# Patient Record
Sex: Female | Born: 1980 | Race: Black or African American | Hispanic: No | Marital: Single | Smoking: Never smoker
Health system: Southern US, Community
[De-identification: ages and names within clinical notes are randomized; demographics above are authoritative.]

## PROBLEM LIST (undated history)

## (undated) ENCOUNTER — Inpatient Hospital Stay (HOSPITAL_COMMUNITY): Payer: Self-pay

## (undated) DIAGNOSIS — Z860100 Personal history of colon polyps, unspecified: Secondary | ICD-10-CM

## (undated) DIAGNOSIS — D509 Iron deficiency anemia, unspecified: Secondary | ICD-10-CM

## (undated) DIAGNOSIS — E041 Nontoxic single thyroid nodule: Secondary | ICD-10-CM

## (undated) DIAGNOSIS — O039 Complete or unspecified spontaneous abortion without complication: Secondary | ICD-10-CM

## (undated) DIAGNOSIS — E282 Polycystic ovarian syndrome: Secondary | ICD-10-CM

## (undated) DIAGNOSIS — N6320 Unspecified lump in the left breast, unspecified quadrant: Secondary | ICD-10-CM

## (undated) DIAGNOSIS — E119 Type 2 diabetes mellitus without complications: Secondary | ICD-10-CM

## (undated) DIAGNOSIS — K589 Irritable bowel syndrome without diarrhea: Secondary | ICD-10-CM

## (undated) HISTORY — DX: Polycystic ovarian syndrome: E28.2

## (undated) HISTORY — DX: Personal history of colon polyps, unspecified: Z86.0100

## (undated) HISTORY — DX: Type 2 diabetes mellitus without complications: E11.9

## (undated) HISTORY — DX: Personal history of colonic polyps: Z86.010

## (undated) HISTORY — DX: Irritable bowel syndrome, unspecified: K58.9

## (undated) HISTORY — DX: Unspecified lump in the left breast, unspecified quadrant: N63.20

## (undated) HISTORY — PX: APPENDECTOMY: SHX54

## (undated) HISTORY — DX: Complete or unspecified spontaneous abortion without complication: O03.9

## (undated) HISTORY — DX: Nontoxic single thyroid nodule: E04.1

## (undated) HISTORY — DX: Iron deficiency anemia, unspecified: D50.9

## (undated) HISTORY — PX: OTHER SURGICAL HISTORY: SHX169

---

## 1998-07-24 ENCOUNTER — Emergency Department (HOSPITAL_COMMUNITY): Admission: EM | Admit: 1998-07-24 | Discharge: 1998-07-24 | Payer: Self-pay | Admitting: Emergency Medicine

## 2002-10-18 ENCOUNTER — Emergency Department (HOSPITAL_COMMUNITY): Admission: EM | Admit: 2002-10-18 | Discharge: 2002-10-19 | Payer: Self-pay | Admitting: Emergency Medicine

## 2002-10-19 ENCOUNTER — Encounter: Payer: Self-pay | Admitting: Emergency Medicine

## 2007-01-31 ENCOUNTER — Emergency Department (HOSPITAL_COMMUNITY): Admission: EM | Admit: 2007-01-31 | Discharge: 2007-02-01 | Payer: Self-pay | Admitting: Emergency Medicine

## 2012-05-08 ENCOUNTER — Ambulatory Visit (INDEPENDENT_AMBULATORY_CARE_PROVIDER_SITE_OTHER): Payer: BC Managed Care – PPO | Admitting: Internal Medicine

## 2012-05-08 VITALS — BP 117/77 | HR 82 | Temp 98.7°F | Resp 16 | Ht 67.18 in | Wt 200.2 lb

## 2012-05-08 DIAGNOSIS — J019 Acute sinusitis, unspecified: Secondary | ICD-10-CM

## 2012-05-08 MED ORDER — CEFDINIR 300 MG PO CAPS: 300.0000 mg | ORAL_CAPSULE | Freq: Two times a day (BID) | ORAL | Status: AC

## 2012-05-08 MED ORDER — FLUCONAZOLE 150 MG PO TABS: 150.0000 mg | ORAL_TABLET | Freq: Once | ORAL | Status: AC

## 2012-05-08 MED ORDER — FLUTICASONE PROPIONATE 50 MCG/ACT NA SUSP: NASAL | Status: DC

## 2012-05-08 NOTE — Progress Notes (Signed)
  Subjective:    Patient ID: Anna Cole, female    DOB: 1981/02/25, 31 y.o.   MRN: [id number]  [facility] Complaining of cough and congestion x3 days Positive chills Cough productive in the morning No sore throat History of allergies History of asthma in childhood but none recent    Review of Systems     Objective:   Physical Exam TMs clear Nares boggy with purulence Throat clear No nodes Chest clear to auscultation/no wheezing       Assessment & Plan:  Problem #1 acute sinusitis Meds ordered this encounter  Medications  . Drospirenone-Ethinyl Estradiol-Levomefol (SAFYRAL) 3-0.03-0.451 MG tablet    Sig: Take 1 tablet by mouth daily.  . cefdinir (OMNICEF) 300 MG capsule    Sig: Take 1 capsule (300 mg total) by mouth 2 (two) times daily.    Dispense:  14 capsule    Refill:  0  . fluticasone (FLONASE) 50 MCG/ACT nasal spray    Sig: 2 sprays each nostril twice a day    Dispense:  1 g    Refill:  1  . fluconazole (DIFLUCAN) 150 MG tablet    Sig: Take 1 tablet (150 mg total) by mouth once.    Dispense:  1 tablet    Refill:  1

## 2014-02-02 ENCOUNTER — Ambulatory Visit (INDEPENDENT_AMBULATORY_CARE_PROVIDER_SITE_OTHER): Payer: BC Managed Care – PPO

## 2014-02-02 ENCOUNTER — Encounter: Payer: Self-pay | Admitting: Family Medicine

## 2014-02-02 ENCOUNTER — Ambulatory Visit (INDEPENDENT_AMBULATORY_CARE_PROVIDER_SITE_OTHER): Payer: BC Managed Care – PPO | Admitting: Family Medicine

## 2014-02-02 VITALS — BP 122/84 | HR 86 | Temp 99.0°F | Resp 18 | Ht 66.75 in | Wt 206.2 lb

## 2014-02-02 DIAGNOSIS — M5430 Sciatica, unspecified side: Secondary | ICD-10-CM

## 2014-02-02 DIAGNOSIS — M545 Low back pain, unspecified: Secondary | ICD-10-CM

## 2014-02-02 DIAGNOSIS — L309 Dermatitis, unspecified: Secondary | ICD-10-CM

## 2014-02-02 MED ORDER — CYCLOBENZAPRINE HCL 10 MG PO TABS: 10.0000 mg | ORAL_TABLET | Freq: Three times a day (TID) | ORAL | Status: DC | PRN

## 2014-02-02 MED ORDER — TRAMADOL HCL 50 MG PO TABS: 50.0000 mg | ORAL_TABLET | Freq: Three times a day (TID) | ORAL | Status: DC | PRN

## 2014-02-02 MED ORDER — MELOXICAM 15 MG PO TABS: 15.0000 mg | ORAL_TABLET | Freq: Every day | ORAL | Status: DC

## 2014-02-02 MED ORDER — TRIAMCINOLONE ACETONIDE 0.1 % EX CREA: 1.0000 "application " | TOPICAL_CREAM | Freq: Two times a day (BID) | CUTANEOUS | Status: DC

## 2014-02-02 NOTE — Patient Instructions (Addendum)
Do not use with any other otc pain medication other than tylenol/acetaminophen - so no aleve, ibuprofen, motrin, advil, etc. I recommend keeping meloxicam on board - esp taking before work. Then when you come home, take a muscle relaxant followed by 15 minutes of heat followed by gentle stretching. Try to do this regiment 2 - 3 times a day.  If you are still having pain in 4 to 6 wks, come back to clinic for further eval. RTC immed if symptoms worsen or you develop any other concerning symptoms below.   Sciatica with Rehab The sciatic nerve runs from the back down the leg and is responsible for sensation and control of the muscles in the back (posterior) side of the thigh, lower leg, and foot. Sciatica is a condition that is characterized by inflammation of this nerve.  SYMPTOMS   Signs of nerve damage, including numbness and/or weakness along the posterior side of the lower extremity.  Pain in the back of the thigh that may also travel down the leg.  Pain that worsens when sitting for long periods of time.  Occasionally, pain in the back or buttock. CAUSES  Inflammation of the sciatic nerve is the cause of sciatica. The inflammation is due to something irritating the nerve. Common sources of irritation include:  Sitting for long periods of time.  Direct trauma to the nerve.  Arthritis of the spine.  Herniated or ruptured disk.  Slipping of the vertebrae (spondylolithesis)  Pressure from soft tissues, such as muscles or ligament-like tissue (fascia). RISK INCREASES WITH:  Sports that place pressure or stress on the spine (football or weightlifting).  Poor strength and flexibility.  Failure to warm-up properly before activity.  Family history of low back pain or disk disorders.  Previous back injury or surgery.  Poor body mechanics, especially when lifting, or poor posture. PREVENTION   Warm up and stretch properly before activity.  Maintain physical  fitness:  Strength, flexibility, and endurance.  Cardiovascular fitness.  Learn and use proper technique, especially with posture and lifting. When possible, have coach correct improper technique.  Avoid activities that place stress on the spine. PROGNOSIS If treated properly, then sciatica usually resolves within 6 weeks. However, occasionally surgery is necessary.  RELATED COMPLICATIONS   Permanent nerve damage, including pain, numbness, tingle, or weakness.  Chronic back pain.  Risks of surgery: infection, bleeding, nerve damage, or damage to surrounding tissues. TREATMENT Treatment initially involves resting from any activities that aggravate your symptoms. The use of ice and medication may help reduce pain and inflammation. The use of strengthening and stretching exercises may help reduce pain with activity. These exercises may be performed at home or with referral to a therapist. A therapist may recommend further treatments, such as transcutaneous electronic nerve stimulation (TENS) or ultrasound. Your caregiver may recommend corticosteroid injections to help reduce inflammation of the sciatic nerve. If symptoms persist despite non-surgical (conservative) treatment, then surgery may be recommended. MEDICATION  If pain medication is necessary, then nonsteroidal anti-inflammatory medications, such as aspirin and ibuprofen, or other minor pain relievers, such as acetaminophen, are often recommended.  Do not take pain medication for 7 days before surgery.  Prescription pain relievers may be given if deemed necessary by your caregiver. Use only as directed and only as much as you need.  Ointments applied to the skin may be helpful.  Corticosteroid injections may be given by your caregiver. These injections should be reserved for the most serious cases, because they may only be  given a certain number of times. HEAT AND COLD  Cold treatment (icing) relieves pain and reduces  inflammation. Cold treatment should be applied for 10 to 15 minutes every 2 to 3 hours for inflammation and pain and immediately after any activity that aggravates your symptoms. Use ice packs or massage the area with a piece of ice (ice massage).  Heat treatment may be used prior to performing the stretching and strengthening activities prescribed by your caregiver, physical therapist, or athletic trainer. Use a heat pack or soak the injury in warm water. SEEK MEDICAL CARE IF:  Treatment seems to offer no benefit, or the condition worsens.  Any medications produce adverse side effects. EXERCISES  RANGE OF MOTION (ROM) AND STRETCHING EXERCISES - Sciatica Most people with sciatic will find that their symptoms worsen with either excessive bending forward (flexion) or arching at the low back (extension). The exercises which will help resolve your symptoms will focus on the opposite motion. Your physician, physical therapist or athletic trainer will help you determine which exercises will be most helpful to resolve your low back pain. Do not complete any exercises without first consulting with your clinician. Discontinue any exercises which worsen your symptoms until you speak to your clinician. If you have pain, numbness or tingling which travels down into your buttocks, leg or foot, the goal of the therapy is for these symptoms to move closer to your back and eventually resolve. Occasionally, these leg symptoms will get better, but your low back pain may worsen; this is typically an indication of progress in your rehabilitation. Be certain to be very alert to any changes in your symptoms and the activities in which you participated in the 24 hours prior to the change. Sharing this information with your clinician will allow him/her to most efficiently treat your condition. These exercises may help you when beginning to rehabilitate your injury. Your symptoms may resolve with or without further involvement  from your physician, physical therapist or athletic trainer. While completing these exercises, remember:   Restoring tissue flexibility helps normal motion to return to the joints. This allows healthier, less painful movement and activity.  An effective stretch should be held for at least 30 seconds.  A stretch should never be painful. You should only feel a gentle lengthening or release in the stretched tissue. FLEXION RANGE OF MOTION AND STRETCHING EXERCISES: STRETCH  Flexion, Single Knee to Chest   Lie on a firm bed or floor with both legs extended in front of you.  Keeping one leg in contact with the floor, bring your opposite knee to your chest. Hold your leg in place by either grabbing behind your thigh or at your knee.  Pull until you feel a gentle stretch in your low back. Hold __________ seconds.  Slowly release your grasp and repeat the exercise with the opposite side. Repeat __________ times. Complete this exercise __________ times per day.  STRETCH  Flexion, Double Knee to Chest  Lie on a firm bed or floor with both legs extended in front of you.  Keeping one leg in contact with the floor, bring your opposite knee to your chest.  Tense your stomach muscles to support your back and then lift your other knee to your chest. Hold your legs in place by either grabbing behind your thighs or at your knees.  Pull both knees toward your chest until you feel a gentle stretch in your low back. Hold __________ seconds.  Tense your stomach muscles and slowly return  one leg at a time to the floor. Repeat __________ times. Complete this exercise __________ times per day.  STRETCH  Low Trunk Rotation   Lie on a firm bed or floor. Keeping your legs in front of you, bend your knees so they are both pointed toward the ceiling and your feet are flat on the floor.  Extend your arms out to the side. This will stabilize your upper body by keeping your shoulders in contact with the  floor.  Gently and slowly drop both knees together to one side until you feel a gentle stretch in your low back. Hold for __________ seconds.  Tense your stomach muscles to support your low back as you bring your knees back to the starting position. Repeat the exercise to the other side. Repeat __________ times. Complete this exercise __________ times per day  EXTENSION RANGE OF MOTION AND FLEXIBILITY EXERCISES: STRETCH  Extension, Prone on Elbows  Lie on your stomach on the floor, a bed will be too soft. Place your palms about shoulder width apart and at the height of your head.  Place your elbows under your shoulders. If this is too painful, stack pillows under your chest.  Allow your body to relax so that your hips drop lower and make contact more completely with the floor.  Hold this position for __________ seconds.  Slowly return to lying flat on the floor. Repeat __________ times. Complete this exercise __________ times per day.  RANGE OF MOTION  Extension, Prone Press Ups  Lie on your stomach on the floor, a bed will be too soft. Place your palms about shoulder width apart and at the height of your head.  Keeping your back as relaxed as possible, slowly straighten your elbows while keeping your hips on the floor. You may adjust the placement of your hands to maximize your comfort. As you gain motion, your hands will come more underneath your shoulders.  Hold this position __________ seconds.  Slowly return to lying flat on the floor. Repeat __________ times. Complete this exercise __________ times per day.  STRENGTHENING EXERCISES - Sciatica  These exercises may help you when beginning to rehabilitate your injury. These exercises should be done near your "sweet spot." This is the neutral, low-back arch, somewhere between fully rounded and fully arched, that is your least painful position. When performed in this safe range of motion, these exercises can be used for people who have  either a flexion or extension based injury. These exercises may resolve your symptoms with or without further involvement from your physician, physical therapist or athletic trainer. While completing these exercises, remember:   Muscles can gain both the endurance and the strength needed for everyday activities through controlled exercises.  Complete these exercises as instructed by your physician, physical therapist or athletic trainer. Progress with the resistance and repetition exercises only as your caregiver advises.  You may experience muscle soreness or fatigue, but the pain or discomfort you are trying to eliminate should never worsen during these exercises. If this pain does worsen, stop and make certain you are following the directions exactly. If the pain is still present after adjustments, discontinue the exercise until you can discuss the trouble with your clinician. STRENGTHENING Deep Abdominals, Pelvic Tilt   Lie on a firm bed or floor. Keeping your legs in front of you, bend your knees so they are both pointed toward the ceiling and your feet are flat on the floor.  Tense your lower abdominal muscles to press  your low back into the floor. This motion will rotate your pelvis so that your tail bone is scooping upwards rather than pointing at your feet or into the floor.  With a gentle tension and even breathing, hold this position for __________ seconds. Repeat __________ times. Complete this exercise __________ times per day.  STRENGTHENING  Abdominals, Crunches   Lie on a firm bed or floor. Keeping your legs in front of you, bend your knees so they are both pointed toward the ceiling and your feet are flat on the floor. Cross your arms over your chest.  Slightly tip your chin down without bending your neck.  Tense your abdominals and slowly lift your trunk high enough to just clear your shoulder blades. Lifting higher can put excessive stress on the low back and does not further  strengthen your abdominal muscles.  Control your return to the starting position. Repeat __________ times. Complete this exercise __________ times per day.  STRENGTHENING  Quadruped, Opposite UE/LE Lift  Assume a hands and knees position on a firm surface. Keep your hands under your shoulders and your knees under your hips. You may place padding under your knees for comfort.  Find your neutral spine and gently tense your abdominal muscles so that you can maintain this position. Your shoulders and hips should form a rectangle that is parallel with the floor and is not twisted.  Keeping your trunk steady, lift your right hand no higher than your shoulder and then your left leg no higher than your hip. Make sure you are not holding your breath. Hold this position __________ seconds.  Continuing to keep your abdominal muscles tense and your back steady, slowly return to your starting position. Repeat with the opposite arm and leg. Repeat __________ times. Complete this exercise __________ times per day.  STRENGTHENING  Abdominals and Quadriceps, Straight Leg Raise   Lie on a firm bed or floor with both legs extended in front of you.  Keeping one leg in contact with the floor, bend the other knee so that your foot can rest flat on the floor.  Find your neutral spine, and tense your abdominal muscles to maintain your spinal position throughout the exercise.  Slowly lift your straight leg off the floor about 6 inches for a count of 15, making sure to not hold your breath.  Still keeping your neutral spine, slowly lower your leg all the way to the floor. Repeat this exercise with each leg __________ times. Complete this exercise __________ times per day. POSTURE AND BODY MECHANICS CONSIDERATIONS - Sciatica Keeping correct posture when sitting, standing or completing your activities will reduce the stress put on different body tissues, allowing injured tissues a chance to heal and limiting painful  experiences. The following are general guidelines for improved posture. Your physician or physical therapist will provide you with any instructions specific to your needs. While reading these guidelines, remember:  The exercises prescribed by your provider will help you have the flexibility and strength to maintain correct postures.  The correct posture provides the optimal environment for your joints to work. All of your joints have less wear and tear when properly supported by a spine with good posture. This means you will experience a healthier, less painful body.  Correct posture must be practiced with all of your activities, especially prolonged sitting and standing. Correct posture is as important when doing repetitive low-stress activities (typing) as it is when doing a single heavy-load activity (lifting). RESTING POSITIONS Consider which  positions are most painful for you when choosing a resting position. If you have pain with flexion-based activities (sitting, bending, stooping, squatting), choose a position that allows you to rest in a less flexed posture. You would want to avoid curling into a fetal position on your side. If your pain worsens with extension-based activities (prolonged standing, working overhead), avoid resting in an extended position such as sleeping on your stomach. Most people will find more comfort when they rest with their spine in a more neutral position, neither too rounded nor too arched. Lying on a non-sagging bed on your side with a pillow between your knees, or on your back with a pillow under your knees will often provide some relief. Keep in mind, being in any one position for a prolonged period of time, no matter how correct your posture, can still lead to stiffness. PROPER SITTING POSTURE In order to minimize stress and discomfort on your spine, you must sit with correct posture Sitting with good posture should be effortless for a healthy body. Returning to good  posture is a gradual process. Many people can work toward this most comfortably by using various supports until they have the flexibility and strength to maintain this posture on their own. When sitting with proper posture, your ears will fall over your shoulders and your shoulders will fall over your hips. You should use the back of the chair to support your upper back. Your low back will be in a neutral position, just slightly arched. You may place a small pillow or folded towel at the base of your low back for support.  When working at a desk, create an environment that supports good, upright posture. Without extra support, muscles fatigue and lead to excessive strain on joints and other tissues. Keep these recommendations in mind: CHAIR:   A chair should be able to slide under your desk when your back makes contact with the back of the chair. This allows you to work closely.  The chair's height should allow your eyes to be level with the upper part of your monitor and your hands to be slightly lower than your elbows. BODY POSITION  Your feet should make contact with the floor. If this is not possible, use a foot rest.  Keep your ears over your shoulders. This will reduce stress on your neck and low back. INCORRECT SITTING POSTURES   If you are feeling tired and unable to assume a healthy sitting posture, do not slouch or slump. This puts excessive strain on your back tissues, causing more damage and pain. Healthier options include:  Using more support, like a lumbar pillow.  Switching tasks to something that requires you to be upright or walking.  Talking a brief walk.  Lying down to rest in a neutral-spine position. PROLONGED STANDING WHILE SLIGHTLY LEANING FORWARD  When completing a task that requires you to lean forward while standing in one place for a long time, place either foot up on a stationary 2-4 inch high object to help maintain the best posture. When both feet are on the  ground, the low back tends to lose its slight inward curve. If this curve flattens (or becomes too large), then the back and your other joints will experience too much stress, fatigue more quickly and can cause pain.  CORRECT STANDING POSTURES Proper standing posture should be assumed with all daily activities, even if they only take a few moments, like when brushing your teeth. As in sitting, your ears should  fall over your shoulders and your shoulders should fall over your hips. You should keep a slight tension in your abdominal muscles to brace your spine. Your tailbone should point down to the ground, not behind your body, resulting in an over-extended swayback posture.  INCORRECT STANDING POSTURES  Common incorrect standing postures include a forward head, locked knees and/or an excessive swayback. WALKING Walk with an upright posture. Your ears, shoulders and hips should all line-up. PROLONGED ACTIVITY IN A FLEXED POSITION When completing a task that requires you to bend forward at your waist or lean over a low surface, try to find a way to stabilize 3 of 4 of your limbs. You can place a hand or elbow on your thigh or rest a knee on the surface you are reaching across. This will provide you more stability so that your muscles do not fatigue as quickly. By keeping your knees relaxed, or slightly bent, you will also reduce stress across your low back. CORRECT LIFTING TECHNIQUES DO :   Assume a wide stance. This will provide you more stability and the opportunity to get as close as possible to the object which you are lifting.  Tense your abdominals to brace your spine; then bend at the knees and hips. Keeping your back locked in a neutral-spine position, lift using your leg muscles. Lift with your legs, keeping your back straight.  Test the weight of unknown objects before attempting to lift them.  Try to keep your elbows locked down at your sides in order get the best strength from your  shoulders when carrying an object.  Always ask for help when lifting heavy or awkward objects. INCORRECT LIFTING TECHNIQUES DO NOT:   Lock your knees when lifting, even if it is a small object.  Bend and twist. Pivot at your feet or move your feet when needing to change directions.  Assume that you cannot safely pick up a paperclip without proper posture. Document Released: 08/11/2005 Document Revised: 11/03/2011 Document Reviewed: 11/23/2008 [facility] Patient Information 2014 [place], [place]. Piriformis Syndrome with Rehab Piriformis syndrome is a condition the affects the nervous system in the area of the hip, and is characterized by pain and possibly a loss of feeling in the backside (posterior) thigh that may extend down the entire length of the leg. The symptoms are caused by an increase in pressure on the sciatic nerve by the piriformis muscle, which is on the back of the hip and is responsible for externally rotating the hip. The sciatic nerve and its branches connect to much of the leg. Normally the sciatic nerve runs between the piriformis muscle and other muscles. However, in certain individuals the nerve runs through the muscle, which causes an increase in pressure on the nerve and results in the symptoms of piriformis syndrome. SYMPTOMS   Pain, tingling, numbness, or burning in the back of the thigh that may also extend down the entire leg.  Occasionally, tenderness in the buttock.  Loss of function of the leg.  Pain that worsens when using the piriformis muscle (running, jumping, or stairs).  Pain that increases with prolonged sitting.  Pain that is lessened by laying flat on the back. CAUSES   Piriformis syndrome is the result of an increase in pressure placed on the sciatic nerve. Often times piriformis syndrome is an overuse injury.  Stress placed on the nerve from a sudden increase in the intensity, frequency, or duration of training.  Compensation of other  extremity injuries. RISK INCREASES WITH:  Sports that involve the piriformis muscle (running, walking or jumping).  You are born with (congenital) a defect in which the sciatic nerve passes through the muscle. PREVENTION  Warm up and stretch properly before activity.  Allow for adequate recovery between workouts.  Maintain physical fitness:  Strength, flexibility, and endurance.  Cardiovascular fitness. PROGNOSIS  If treated properly, then the symptoms of piriformis syndrome usually resolve in 2 to 6 weeks. RELATED COMPLICATIONS   Persistent and possibly permanent pain and numbness in the lower extremity.  Weakness of the extremity that may progress to disability and inability to compete. TREATMENT  The most effective treatment for piriformis syndrome is rest from any activities that aggravate the symptoms. Ice and pain medication may help reduce pain and inflammation. The use of strengthening and stretching exercises may help reduce pain with activity. These exercises may be performed at home or with a therapist. A referral to a therapist may be given for further evaluation and treatment, such as ultrasound. Corticosteroid injections may be given to reduce inflammation that is causing pressure to be placed on the sciatic nerve. If non-surgical (conservative) treatment is unsuccessful, then surgery may be recommended.  MEDICATION   If pain medication is necessary, then nonsteroidal anti-inflammatory medications, such as aspirin and ibuprofen, or other minor pain relievers, such as acetaminophen, are often recommended.  Do not take pain medication for 7 days before surgery.  Prescription pain relievers may be given if deemed necessary by your caregiver. Use only as directed and only as much as you need.  Corticosteroid injections may be given by your caregiver. These injections should be reserved for the most serious cases, because they may only be given a certain number of  times. HEAT AND COLD:   Cold treatment (icing) relieves pain and reduces inflammation. Cold treatment should be applied for 10 to 15 minutes every 2 to 3 hours for inflammation and pain and immediately after any activity that aggravates your symptoms. Use ice packs or massage the area with a piece of ice (ice massage).  Heat treatment may be used prior to performing the stretching and strengthening activities prescribed by your caregiver, physical therapist, or athletic trainer. Use a heat pack or soak the injury in warm water. SEEK IMMEDIATE MEDICAL CARE IF:  Treatment seems to offer no benefit, or the condition worsens.  Any medications produce adverse side effects. EXERCISES RANGE OF MOTION (ROM) AND STRETCHING EXERCISES - Piriformis Syndrome These exercises may help you when beginning to rehabilitate your injury. Your symptoms may resolve with or without further involvement from your physician, physical therapist or athletic trainer. While completing these exercises, remember:   Restoring tissue flexibility helps normal motion to return to the joints. This allows healthier, less painful movement and activity.  An effective stretch should be held for at least 30 seconds.  A stretch should never be painful. You should only feel a gentle lengthening or release in the stretched tissue. STRETCH - Hip Rotators  Lie on your back on a firm surface. Grasp your right / left knee with your right / left hand and your ankle with your opposite hand.  Keeping your hips and shoulders firmly planted, gently pull your right / left knee and rotate your lower leg toward your opposite shoulder until you feel a stretch in your buttocks.  Hold this stretch for __________ seconds. Repeat this stretch __________ times. Complete this stretch __________ times per day. STRETCH  Iliotibial Band  On the floor or bed, lie on your  side so your right / left leg is on top. Bend your knee and grab your  ankle.  Slowly bring your knee back so that your thigh is in line with your trunk. Keep your heel at your buttocks and gently arch your back so your head, shoulders and hips line up.  Slowly lower your leg so that your knee approaches the floor/bed until you feel a gentle stretch on the outside of your right / left thigh. If you do not feel a stretch and your knee will not fall farther, place the heel of your opposite foot on top of your knee and pull your thigh down farther.  Hold this stretch for __________ seconds. Repeat __________ times. Complete __________ times per day. STRENGTHENING EXERCISES - Piriformis Syndrome  These are some of the caregiver again or until your symptoms are resolved. Remember:   Strong muscles with good endurance tolerate stress better.  Do the exercises as initially prescribed by your caregiver. Progress slowly with each exercise, gradually increasing the number of repetitions and weight used under their guidance. STRENGTH - Hip Abductors, Straight Leg Raises Be aware of your form throughout the entire exercise so that you exercise the correct muscles. Sloppy form means that you are not strengthening the correct muscles.  Lie on your side so that your head, shoulders, knee and hip line up. You may bend your lower knee to help maintain your balance. Your right / left leg should be on top.  Roll your hips slightly forward, so that your hips are stacked directly over each other and your right / left knee is facing forward.  Lift your top leg up 4-6 inches, leading with your heel. Be sure that your foot does not drift forward or that your knee does not roll toward the ceiling.  Hold this position for __________ seconds. You should feel the muscles in your outer hip lifting (you may not notice this until your leg begins to tire).  Slowly lower your leg to the starting position. Allow the muscles to fully relax before beginning the next repetition. Repeat __________  times. Complete this exercise __________ times per day.  STRENGTH - Hip Abductors, Quadriped  On a firm, lightly padded surface, position yourself on your hands and knees. Your hands should be directly below your shoulders and your knees should be directly below your hips.  Keeping your right / left knee bent, lift your leg out to the side. Keep your legs level and in line with your shoulders.  Position yourself on your hands and knees.  Hold for __________ seconds.  Keeping your trunk steady and your hips level, slowly lower your leg to the starting position. Repeat __________ times. Complete this exercise __________ times per day.  STRENGTH - Hip Abductors, Standing  Tie one end of a rubber exercise band/tubing to a secure surface (table, pole) and tie a loop at the other end.  Place the loop around your right / left ankle. Keeping your ankle with the band directly opposite of the secured end, step away until there is tension in the tube/band.  Hold onto a chair as needed for balance.  Keeping your back upright, your shoulders over your hips, and your toes pointing forward, lift your right / left leg out to your side. Be sure to lift your leg with your hip muscles. Do not "throw" your leg or tip your body to lift your leg.  Slowly and with control, return to the starting position. Repeat exercise __________  times. Complete this exercise __________ times per day.  Document Released: 08/11/2005 Document Revised: 02/10/2012 Document Reviewed: 11/23/2008 [facility] Patient Information 2014 [place], [place].

## 2014-02-02 NOTE — Progress Notes (Signed)
Subjective:    Patient ID: Anna Cole, female    DOB: 1981-08-22, 33 y.o.   MRN: [id number] Chief Complaint  Patient presents with  . Back Pain    Lower back, hurt back tuesday at work lifting chairs, felt pain go down right leg when incident happend    HPI  Past Medical History  Diagnosis Date  . IBS (irritable bowel syndrome)   . Polycystic ovarian syndrome    Current Outpatient Prescriptions on File Prior to Visit  Medication Sig Dispense Refill  . Drospirenone-Ethinyl Estradiol-Levomefol (SAFYRAL) 3-0.03-0.451 MG tablet Take 1 tablet by mouth daily.      . fluticasone (FLONASE) 50 MCG/ACT nasal spray 2 sprays each nostril twice a day  1 g  1   No current facility-administered medications on file prior to visit.   Allergies  Allergen Reactions  . Penicillins Hives and Itching  . Sulfa Antibiotics Hives     Review of Systems  Constitutional: Negative for fever and chills.  Gastrointestinal: Negative for nausea, vomiting, abdominal pain, diarrhea and constipation.  Genitourinary: Negative for urgency, frequency, decreased urine volume and difficulty urinating.  Musculoskeletal: Positive for back pain and myalgias. Negative for arthralgias, gait problem and joint swelling.  Neurological: Negative for weakness and numbness.  Hematological: Negative for adenopathy. Does not bruise/bleed easily.      BP 122/84  Pulse 86  Temp(Src) 99 F (37.2 C) (Oral)  Resp 18  Ht 5' 6.75" (1.695 m)  Wt 206 lb 3.2 oz (93.532 kg)  BMI 32.56 kg/m2  SpO2 99%  LMP 01/17/2014 Objective:   Physical Exam  Constitutional: She is oriented to person, place, and time. She appears well-developed and well-nourished.  HENT:  Head: Normocephalic.  Eyes: Conjunctivae are normal. No scleral icterus.  Neck: Normal range of motion. Neck supple.  Cardiovascular: Normal rate, regular rhythm and normal heart sounds.   Pulmonary/Chest: Effort normal and breath sounds normal. No respiratory  distress.  Musculoskeletal: Normal range of motion. She exhibits no edema.       Right hip: Normal.       Left hip: Normal.       Lumbar back: She exhibits tenderness, pain and spasm. She exhibits normal range of motion, no bony tenderness and no deformity.  Neurological: She is alert and oriented to person, place, and time. She has normal strength and normal reflexes. No sensory deficit. She exhibits normal muscle tone. Coordination and gait normal.  Reflex Scores:      Patellar reflexes are 2+ on the right side and 2+ on the left side.      Achilles reflexes are 2+ on the right side and 2+ on the left side. Skin: Skin is warm and dry. Rash noted. Rash is macular. No erythema.  Psychiatric: She has a normal mood and affect. Her behavior is normal.         [facility] reading (PRIMARY) by  Dr. Clelia CroftShaw. L-spine: no acute or bony abnormality seen   EXAM: LUMBAR SPINE - COMPLETE 4+ VIEW  COMPARISON: None.  FINDINGS: Five non rib-bearing lumbar vertebrae with anatomic alignment. Slight straightening of the usual lumbar lordosis. No fractures. Minimal disc space narrowing at L4-5. Remaining disc spaces well preserved. No pars defects. No significant facet arthropathy. Visualized sacroiliac joints intact.  IMPRESSION: 1. No acute osseous abnormality. 2. Straightening of the usual lordosis which may reflect positioning and/or spasm. 3. Minimal disc space narrowing at L4-5  Assessment & Plan:  If no improvement in pain, wil be willing  to call her in steroid dose pack but if continues > 4-6 wks or worsening at all w any alarm sxs, RTC for further eval.  Low back pain - Plan: DG Lumbar Spine Complete  Sciatica  Eczema  Meds ordered this encounter  Medications  . meloxicam (MOBIC) 15 MG tablet    Sig: Take 1 tablet (15 mg total) by mouth daily.    Dispense:  30 tablet    Refill:  1  . cyclobenzaprine (FLEXERIL) 10 MG tablet    Sig: Take 1 tablet (10 mg total) by mouth 3 (three) times  daily as needed for muscle spasms.    Dispense:  30 tablet    Refill:  0  . traMADol (ULTRAM) 50 MG tablet    Sig: Take 1 tablet (50 mg total) by mouth every 8 (eight) hours as needed.    Dispense:  30 tablet    Refill:  0  . triamcinolone cream (KENALOG) 0.1 %    Sig: Apply 1 application topically 2 (two) times daily.    Dispense:  85.2 g    Refill:  1   [name], MD MPH

## 2014-05-20 ENCOUNTER — Ambulatory Visit (INDEPENDENT_AMBULATORY_CARE_PROVIDER_SITE_OTHER): Payer: BC Managed Care – PPO | Admitting: Family Medicine

## 2014-05-20 VITALS — BP 110/74 | HR 93 | Temp 98.2°F | Resp 18 | Ht 65.5 in | Wt 210.0 lb

## 2014-05-20 DIAGNOSIS — M5430 Sciatica, unspecified side: Secondary | ICD-10-CM

## 2014-05-20 DIAGNOSIS — N760 Acute vaginitis: Secondary | ICD-10-CM

## 2014-05-20 DIAGNOSIS — J4520 Mild intermittent asthma, uncomplicated: Secondary | ICD-10-CM

## 2014-05-20 DIAGNOSIS — J3089 Other allergic rhinitis: Secondary | ICD-10-CM

## 2014-05-20 DIAGNOSIS — A499 Bacterial infection, unspecified: Secondary | ICD-10-CM

## 2014-05-20 DIAGNOSIS — M5416 Radiculopathy, lumbar region: Secondary | ICD-10-CM

## 2014-05-20 DIAGNOSIS — IMO0002 Reserved for concepts with insufficient information to code with codable children: Secondary | ICD-10-CM

## 2014-05-20 DIAGNOSIS — N898 Other specified noninflammatory disorders of vagina: Secondary | ICD-10-CM

## 2014-05-20 DIAGNOSIS — B9689 Other specified bacterial agents as the cause of diseases classified elsewhere: Secondary | ICD-10-CM

## 2014-05-20 DIAGNOSIS — M5136 Other intervertebral disc degeneration, lumbar region: Secondary | ICD-10-CM

## 2014-05-20 DIAGNOSIS — M5137 Other intervertebral disc degeneration, lumbosacral region: Secondary | ICD-10-CM

## 2014-05-20 DIAGNOSIS — M5432 Sciatica, left side: Secondary | ICD-10-CM

## 2014-05-20 DIAGNOSIS — J45909 Unspecified asthma, uncomplicated: Secondary | ICD-10-CM

## 2014-05-20 LAB — POCT WET PREP WITH KOH
KOH PREP POC: NEGATIVE
TRICHOMONAS UA: NEGATIVE
YEAST WET PREP PER HPF POC: NEGATIVE

## 2014-05-20 LAB — POCT URINALYSIS DIPSTICK
BILIRUBIN UA: NEGATIVE
Blood, UA: NEGATIVE
Glucose, UA: NEGATIVE
KETONES UA: NEGATIVE
LEUKOCYTES UA: NEGATIVE
Nitrite, UA: NEGATIVE
Protein, UA: NEGATIVE
SPEC GRAV UA: 1.01
Urobilinogen, UA: 0.2
pH, UA: 6

## 2014-05-20 LAB — POCT UA - MICROSCOPIC ONLY
BACTERIA, U MICROSCOPIC: NEGATIVE
Casts, Ur, LPF, POC: NEGATIVE
Crystals, Ur, HPF, POC: NEGATIVE
Mucus, UA: NEGATIVE
RBC, URINE, MICROSCOPIC: NEGATIVE
Yeast, UA: NEGATIVE

## 2014-05-20 MED ORDER — FLUTICASONE FUROATE 200 MCG/ACT IN AEPB: 1.0000 | INHALATION_SPRAY | Freq: Every morning | RESPIRATORY_TRACT | Status: DC

## 2014-05-20 MED ORDER — METRONIDAZOLE 500 MG PO TABS: 500.0000 mg | ORAL_TABLET | Freq: Two times a day (BID) | ORAL | Status: DC

## 2014-05-20 MED ORDER — CELECOXIB 200 MG PO CAPS: 200.0000 mg | ORAL_CAPSULE | Freq: Every day | ORAL | Status: DC

## 2014-05-20 MED ORDER — FLUTICASONE PROPIONATE 50 MCG/ACT NA SUSP: 2.0000 | Freq: Every day | NASAL | Status: DC

## 2014-05-20 MED ORDER — CETIRIZINE HCL 10 MG PO TABS: 10.0000 mg | ORAL_TABLET | Freq: Every day | ORAL | Status: DC

## 2014-05-20 MED ORDER — PREDNISONE 10 MG PO TABS: ORAL_TABLET | ORAL | Status: DC

## 2014-05-20 MED ORDER — ALBUTEROL SULFATE (2.5 MG/3ML) 0.083% IN NEBU
2.5000 mg | INHALATION_SOLUTION | Freq: Once | RESPIRATORY_TRACT | Status: AC
  Administered 2014-05-20: 2.5 mg via RESPIRATORY_TRACT

## 2014-05-20 MED ORDER — ALBUTEROL SULFATE HFA 108 (90 BASE) MCG/ACT IN AERS: 2.0000 | INHALATION_SPRAY | RESPIRATORY_TRACT | Status: DC | PRN

## 2014-05-20 NOTE — Progress Notes (Addendum)
Subjective:  This chart was scribed for [name], MD by Andrew Au, ED Scribe. This patient was seen in room 1 and the patient's care was started at 12:14 PM.  Patient ID: Anna Cole, female    DOB: 26-Dec-1980, 33 y.o.   MRN: [id number]  Cough Associated symptoms include ear pain, myalgias, postnasal drip, shortness of breath and wheezing. Her past medical history is significant for asthma.  Asthma She complains of cough, shortness of breath and wheezing. Associated symptoms include ear pain, myalgias and postnasal drip. Her past medical history is significant for asthma.  Vaginal Discharge The patient's primary symptoms include a vaginal discharge.   Chief Complaint  Patient presents with  . Cough    x 3 weeks not coughing up anything  . Nasal Congestion    x 3 weeks  . Asthma    x 3 weeks thinks it all can be from asthma  . Vaginal Discharge    x 1 week w/o itching    HPI Comments: Anna Cole is a 33 y.o. female who presents to the [facility] complaining of a tight cough and nasal congestion that began 3 weeks ago. Pt reports she has recently changed position at work and has moved into an old leaky office building that she believes contains mold and mildew that is causing her allergies to flare up.  Pt reports associated chest tightness SOB, wheeze this morning as well as maxillary sinus tenderness, and otalgia, postnasal drip throughout the week. She has tried sudafed, zyrtec, and nasal sprays. Pt tried to use an old inhaler last night without relief to symptoms. Pt also took Nyquil last night without relief.   Pt reports shooting pain down posterior left leg  Pt believes this pain is from a prior injury that she was seen for 3 months ago after lifting a chair at school.  Pt states she only feels this pain when standing for long periods of time. Pt reports she has been working out and constantly moving to try to loosen up with out relief. Pt denies  weakness.  Pt is also complaining vaginal discharge for 1 week with associated itching.   Pt c/o she  white "pimples" on left nipple. Pt reports her gynecologist has popped them in the past but has now returned enlarged.  Past Medical History  Diagnosis Date  . IBS (irritable bowel syndrome)   . Polycystic ovarian syndrome    Past Surgical History  Procedure Laterality Date  . Appendectomy     Prior to Admission medications   Medication Sig Start Date End Date Taking? Authorizing Provider  cyclobenzaprine (FLEXERIL) 10 MG tablet Take 1 tablet (10 mg total) by mouth 3 (three) times daily as needed for muscle spasms. 02/02/14  Yes Sherren Mocha, MD  Drospirenone-Ethinyl Estradiol-Levomefol (SAFYRAL) 3-0.03-0.451 MG tablet Take 1 tablet by mouth daily.   Yes Historical Provider, MD  fluticasone (FLONASE) 50 MCG/ACT nasal spray 2 sprays each nostril twice a day 05/08/12  Yes Tonye Pearson, MD  meloxicam (MOBIC) 15 MG tablet Take 1 tablet (15 mg total) by mouth daily. 02/02/14  Yes Sherren Mocha, MD  triamcinolone cream (KENALOG) 0.1 % Apply 1 application topically 2 (two) times daily. 02/02/14  Yes Sherren Mocha, MD   Review of Systems  HENT: Positive for congestion, ear pain and postnasal drip.   Respiratory: Positive for cough, chest tightness, shortness of breath and wheezing.   Genitourinary: Positive for vaginal discharge.  Musculoskeletal:  Positive for myalgias.  Neurological: Negative for weakness and numbness.   Objective:   Physical Exam  HENT:  Right Ear: Hearing, tympanic membrane, external ear and ear canal normal.  Left Ear: Hearing, tympanic membrane, external ear and ear canal normal.  Nose: Mucosal edema and rhinorrhea present.  Mouth/Throat: Posterior oropharyngeal erythema present.  Neck: No thyromegaly present.  Cardiovascular: Normal rate, regular rhythm, normal heart sounds and intact distal pulses.  Exam reveals no gallop and no friction rub.   No murmur  heard. Pulmonary/Chest: Effort normal and breath sounds normal. No respiratory distress. She has no wheezes. She has no rales. She exhibits no tenderness.  Musculoskeletal:  Positive straight leg raise on left. TTP over lower lumbar spinous process. Mild tender over left SI joints.  5/5 bilateral and lower extremity pain.   Lymphadenopathy:       Head (right side): Tonsillar adenopathy present.       Head (left side): Tonsillar adenopathy present.    She has cervical adenopathy ( anterior).       Right cervical: No superficial cervical and no posterior cervical adenopathy present.      Left cervical: No superficial cervical and no posterior cervical adenopathy present.  Neurological:  Reflex Scores:      Patellar reflexes are 2+ on the right side. Unable to elicit left patellar DTR.   BP 110/74  Pulse 93  Temp(Src) 98.2 F (36.8 C) (Oral)  Resp 18  Ht 5' 5.5" (1.664 m)  Wt 210 lb (95.255 kg)  BMI 34.40 kg/m2  SpO2 99%  PF 360 L/min  LMP 05/10/2014  Results for orders placed in visit on 05/20/14  POCT UA - MICROSCOPIC ONLY      Result Value Ref Range   WBC, Ur, HPF, POC 0-1     RBC, urine, microscopic neg     Bacteria, U Microscopic neg     Mucus, UA neg     Epithelial cells, urine per micros 1-2     Crystals, Ur, HPF, POC neg     Casts, Ur, LPF, POC neg     Yeast, UA neg    POCT URINALYSIS DIPSTICK      Result Value Ref Range   Color, UA yellow     Clarity, UA clear     Glucose, UA neg     Bilirubin, UA neg     Ketones, UA neg     Spec Grav, UA 1.010     Blood, UA neg     pH, UA 6.0     Protein, UA neg     Urobilinogen, UA 0.2     Nitrite, UA neg     Leukocytes, UA Negative    POCT WET PREP WITH KOH      Result Value Ref Range   Trichomonas, UA Negative     Clue Cells Wet Prep HPF POC 75%     Epithelial Wet Prep HPF POC 20-25     Yeast Wet Prep HPF POC neg     Bacteria Wet Prep HPF POC 4+     RBC Wet Prep HPF POC 2-4     WBC Wet Prep HPF POC 2-5     KOH  Prep POC Negative      Assessment & Plan:  Vaginal discharge - Plan: POCT UA - Microscopic Only, POCT urinalysis dipstick, POCT Wet Prep with KOH  Unspecified asthma(493.90) - Plan: albuterol (PROVENTIL) (2.5 MG/3ML) 0.083% nebulizer solution 2.5 mg - try alb inhaler with prednisone  taper.   DDD (degenerative disc disease), lumbar - Plan: Ambulatory referral to Physical Therapy  Lumbar radiculopathy, acute - Plan: Ambulatory referral to Physical Therapy  Sciatica associated with disorder of lumbosacral spine, left - Plan: Ambulatory referral to Physical Therapy -prednisone taper. Start celebrex after taper. try PT, if not resolved after that, rec f/u of MRI and referral to ortho.    Other allergic rhinitis - flonase and zyrtec, rec netti pot w/ sudafed qam  Reactive airway disease, mild intermittent, uncomplicated  Bacterial vaginosis  Meds ordered this encounter  Medications  . albuterol (PROVENTIL) (2.5 MG/3ML) 0.083% nebulizer solution 2.5 mg    Sig:   . predniSONE (DELTASONE) 10 MG tablet    Sig: 6-5-4-3-2-1 tabs po qd    Dispense:  21 tablet    Refill:  0  . albuterol (PROVENTIL HFA;VENTOLIN HFA) 108 (90 BASE) MCG/ACT inhaler    Sig: Inhale 2 puffs into the lungs every 4 (four) hours as needed for wheezing or shortness of breath (cough, shortness of breath or wheezing.).    Dispense:  1 Inhaler    Refill:  11  . Fluticasone Furoate (ARNUITY ELLIPTA) 200 MCG/ACT AEPB    Sig: Inhale 1 puff into the lungs every morning.    Dispense:  30 each    Refill:  11  . fluticasone (FLONASE) 50 MCG/ACT nasal spray    Sig: Place 2 sprays into both nostrils at bedtime.    Dispense:  1 g    Refill:  11  . cetirizine (ZYRTEC) 10 MG tablet    Sig: Take 1 tablet (10 mg total) by mouth at bedtime.    Dispense:  30 tablet    Refill:  11  . celecoxib (CELEBREX) 200 MG capsule    Sig: Take 1 capsule (200 mg total) by mouth daily.    Dispense:  30 capsule    Refill:  1  . metroNIDAZOLE  (FLAGYL) 500 MG tablet    Sig: Take 1 tablet (500 mg total) by mouth 2 (two) times daily.    Dispense:  14 tablet    Refill:  0    I personally performed the services described in this documentation, which was scribed in my presence. The recorded information has been reviewed and considered, and addended by me as needed.  [name], MD MPH

## 2014-05-20 NOTE — Patient Instructions (Signed)
We will be proceed with physical therapy - hopefully by the end of that your sciatica is completely gone - if not, I would recommend referral to orthopedic surgery and possible MRI.  After you finish the prednisone taper in 6 days, then start the celebrex every morning to keep your inflammation down.  Keep the fluticasone and cetrizine on board - take every night for the next month.  Use the fluticasone inhaler every morning.  Use the albuterol inhaler whenever you are having any chest tightness or wheezing.  Consider using a nasal steroid or netti pot.  You can augment with sudafed in the morning if necessary.  If your symptoms continue, please come back into clinic as there are additional medications/inhalers/nasal sprays we can try and/or we may want to consider getting allergy testing.  Hay Fever Hay fever is an allergic reaction to particles in the air. It cannot be passed from person to person. It cannot be cured, but it can be controlled. CAUSES  Hay fever is caused by something that triggers an allergic reaction (allergens). The following are examples of allergens:  Ragweed.  Feathers.  Animal dander.  Grass and tree pollens.  Cigarette smoke.  House dust.  Pollution. SYMPTOMS   Sneezing.  Runny or stuffy nose.  Tearing eyes.  Itchy eyes, nose, mouth, throat, skin, or other area.  Sore throat.  Headache.  Decreased sense of smell or taste. DIAGNOSIS Your caregiver will perform a physical exam and ask questions about the symptoms you are having.Allergy testing may be done to determine exactly what triggers your hay fever.  TREATMENT   Over-the-counter medicines may help symptoms. These include:  Antihistamines.  Decongestants. These may help with nasal congestion.  Your caregiver may prescribe medicines if over-the-counter medicines do not work.  Some people benefit from allergy shots when other medicines are not helpful. HOME CARE INSTRUCTIONS   Avoid the  allergen that is causing your symptoms, if possible.  Take all medicine as told by your caregiver. SEEK MEDICAL CARE IF:   You have severe allergy symptoms and your current medicines are not helping.  Your treatment was working at one time, but you are now experiencing symptoms.  You have sinus congestion and pressure.  You develop a fever or headache.  You have thick nasal discharge.  You have asthma and have a worsening cough and wheezing. SEEK IMMEDIATE MEDICAL CARE IF:   You have swelling of your tongue or lips.  You have trouble breathing.  You feel lightheaded or like you are going to faint.  You have cold sweats.  You have a fever. Document Released: 08/11/2005 Document Revised: 11/03/2011 Document Reviewed: 11/06/2010 [facility] Patient Information 2015 [place], [place]. This information is not intended to replace advice given to you by your health care provider. Make sure you discuss any questions you have with your health care provider. Allergic Rhinitis Allergic rhinitis is when the mucous membranes in the nose respond to allergens. Allergens are particles in the air that cause your body to have an allergic reaction. This causes you to release allergic antibodies. Through a chain of events, these eventually cause you to release histamine into the blood stream. Although meant to protect the body, it is this release of histamine that causes your discomfort, such as frequent sneezing, congestion, and an itchy, runny nose.  CAUSES  Seasonal allergic rhinitis (hay fever) is caused by pollen allergens that may come from grasses, trees, and weeds. Year-round allergic rhinitis (perennial allergic rhinitis) is caused by  allergens such as house dust mites, pet dander, and mold spores.  SYMPTOMS   Nasal stuffiness (congestion).  Itchy, runny nose with sneezing and tearing of the eyes. DIAGNOSIS  Your health care provider can help you determine the allergen or allergens that  trigger your symptoms. If you and your health care provider are unable to determine the allergen, skin or blood testing may be used. TREATMENT  Allergic rhinitis does not have a cure, but it can be controlled by:  Medicines and allergy shots (immunotherapy).  Avoiding the allergen. Hay fever may often be treated with antihistamines in pill or nasal spray forms. Antihistamines block the effects of histamine. There are over-the-counter medicines that may help with nasal congestion and swelling around the eyes. Check with your health care provider before taking or giving this medicine.  If avoiding the allergen or the medicine prescribed do not work, there are many new medicines your health care provider can prescribe. Stronger medicine may be used if initial measures are ineffective. Desensitizing injections can be used if medicine and avoidance does not work. Desensitization is when a patient is given ongoing shots until the body becomes less sensitive to the allergen. Make sure you follow up with your health care provider if problems continue. HOME CARE INSTRUCTIONS It is not possible to completely avoid allergens, but you can reduce your symptoms by taking steps to limit your exposure to them. It helps to know exactly what you are allergic to so that you can avoid your specific triggers. SEEK MEDICAL CARE IF:   You have a fever.  You develop a cough that does not stop easily (persistent).  You have shortness of breath.  You start wheezing.  Symptoms interfere with normal daily activities. Document Released: 05/06/2001 Document Revised: 08/16/2013 Document Reviewed: 04/18/2013 [facility] Patient Information 2015 [place], [place]. This information is not intended to replace advice given to you by your health care provider. Make sure you discuss any questions you have with your health care provider. Sciatica with Rehab The sciatic nerve runs from the back down the leg and is responsible for  sensation and control of the muscles in the back (posterior) side of the thigh, lower leg, and foot. Sciatica is a condition that is characterized by inflammation of this nerve.  SYMPTOMS   Signs of nerve damage, including numbness and/or weakness along the posterior side of the lower extremity.  Pain in the back of the thigh that may also travel down the leg.  Pain that worsens when sitting for long periods of time.  Occasionally, pain in the back or buttock. CAUSES  Inflammation of the sciatic nerve is the cause of sciatica. The inflammation is due to something irritating the nerve. Common sources of irritation include:  Sitting for long periods of time.  Direct trauma to the nerve.  Arthritis of the spine.  Herniated or ruptured disk.  Slipping of the vertebrae (spondylolisthesis).  Pressure from soft tissues, such as muscles or ligament-like tissue (fascia). RISK INCREASES WITH:  Sports that place pressure or stress on the spine (football or weightlifting).  Poor strength and flexibility.  Failure to warm up properly before activity.  Family history of low back pain or disk disorders.  Previous back injury or surgery.  Poor body mechanics, especially when lifting, or poor posture. PREVENTION   Warm up and stretch properly before activity.  Maintain physical fitness:  Strength, flexibility, and endurance.  Cardiovascular fitness.  Learn and use proper technique, especially with posture and lifting. When  possible, have coach correct improper technique.  Avoid activities that place stress on the spine. PROGNOSIS If treated properly, then sciatica usually resolves within 6 weeks. However, occasionally surgery is necessary.  RELATED COMPLICATIONS   Permanent nerve damage, including pain, numbness, tingle, or weakness.  Chronic back pain.  Risks of surgery: infection, bleeding, nerve damage, or damage to surrounding tissues. TREATMENT Treatment initially  involves resting from any activities that aggravate your symptoms. The use of ice and medication may help reduce pain and inflammation. The use of strengthening and stretching exercises may help reduce pain with activity. These exercises may be performed at home or with referral to a therapist. A therapist may recommend further treatments, such as transcutaneous electronic nerve stimulation (TENS) or ultrasound. Your caregiver may recommend corticosteroid injections to help reduce inflammation of the sciatic nerve. If symptoms persist despite non-surgical (conservative) treatment, then surgery may be recommended. MEDICATION  If pain medication is necessary, then nonsteroidal anti-inflammatory medications, such as aspirin and ibuprofen, or other minor pain relievers, such as acetaminophen, are often recommended.  Do not take pain medication for 7 days before surgery.  Prescription pain relievers may be given if deemed necessary by your caregiver. Use only as directed and only as much as you need.  Ointments applied to the skin may be helpful.  Corticosteroid injections may be given by your caregiver. These injections should be reserved for the most serious cases, because they may only be given a certain number of times. HEAT AND COLD  Cold treatment (icing) relieves pain and reduces inflammation. Cold treatment should be applied for 10 to 15 minutes every 2 to 3 hours for inflammation and pain and immediately after any activity that aggravates your symptoms. Use ice packs or massage the area with a piece of ice (ice massage).  Heat treatment may be used prior to performing the stretching and strengthening activities prescribed by your caregiver, physical therapist, or athletic trainer. Use a heat pack or soak the injury in warm water. SEEK MEDICAL CARE IF:  Treatment seems to offer no benefit, or the condition worsens.  Any medications produce adverse side effects. EXERCISES  RANGE OF MOTION  (ROM) AND STRETCHING EXERCISES - Sciatica Most people with sciatic will find that their symptoms worsen with either excessive bending forward (flexion) or arching at the low back (extension). The exercises which will help resolve your symptoms will focus on the opposite motion. Your physician, physical therapist or athletic trainer will help you determine which exercises will be most helpful to resolve your low back pain. Do not complete any exercises without first consulting with your clinician. Discontinue any exercises which worsen your symptoms until you speak to your clinician. If you have pain, numbness or tingling which travels down into your buttocks, leg or foot, the goal of the therapy is for these symptoms to move closer to your back and eventually resolve. Occasionally, these leg symptoms will get better, but your low back pain may worsen; this is typically an indication of progress in your rehabilitation. Be certain to be very alert to any changes in your symptoms and the activities in which you participated in the 24 hours prior to the change. Sharing this information with your clinician will allow him/her to most efficiently treat your condition. These exercises may help you when beginning to rehabilitate your injury. Your symptoms may resolve with or without further involvement from your physician, physical therapist or athletic trainer. While completing these exercises, remember:   Restoring tissue flexibility helps  normal motion to return to the joints. This allows healthier, less painful movement and activity.  An effective stretch should be held for at least 30 seconds.  A stretch should never be painful. You should only feel a gentle lengthening or release in the stretched tissue. FLEXION RANGE OF MOTION AND STRETCHING EXERCISES: STRETCH - Flexion, Single Knee to Chest   Lie on a firm bed or floor with both legs extended in front of you.  Keeping one leg in contact with the floor,  bring your opposite knee to your chest. Hold your leg in place by either grabbing behind your thigh or at your knee.  Pull until you feel a gentle stretch in your low back. Hold __________ seconds.  Slowly release your grasp and repeat the exercise with the opposite side. Repeat __________ times. Complete this exercise __________ times per day.  STRETCH - Flexion, Double Knee to Chest  Lie on a firm bed or floor with both legs extended in front of you.  Keeping one leg in contact with the floor, bring your opposite knee to your chest.  Tense your stomach muscles to support your back and then lift your other knee to your chest. Hold your legs in place by either grabbing behind your thighs or at your knees.  Pull both knees toward your chest until you feel a gentle stretch in your low back. Hold __________ seconds.  Tense your stomach muscles and slowly return one leg at a time to the floor. Repeat __________ times. Complete this exercise __________ times per day.  STRETCH - Low Trunk Rotation   Lie on a firm bed or floor. Keeping your legs in front of you, bend your knees so they are both pointed toward the ceiling and your feet are flat on the floor.  Extend your arms out to the side. This will stabilize your upper body by keeping your shoulders in contact with the floor.  Gently and slowly drop both knees together to one side until you feel a gentle stretch in your low back. Hold for __________ seconds.  Tense your stomach muscles to support your low back as you bring your knees back to the starting position. Repeat the exercise to the other side. Repeat __________ times. Complete this exercise __________ times per day  EXTENSION RANGE OF MOTION AND FLEXIBILITY EXERCISES: STRETCH - Extension, Prone on Elbows  Lie on your stomach on the floor, a bed will be too soft. Place your palms about shoulder width apart and at the height of your head.  Place your elbows under your shoulders.  If this is too painful, stack pillows under your chest.  Allow your body to relax so that your hips drop lower and make contact more completely with the floor.  Hold this position for __________ seconds.  Slowly return to lying flat on the floor. Repeat __________ times. Complete this exercise __________ times per day.  RANGE OF MOTION - Extension, Prone Press Ups  Lie on your stomach on the floor, a bed will be too soft. Place your palms about shoulder width apart and at the height of your head.  Keeping your back as relaxed as possible, slowly straighten your elbows while keeping your hips on the floor. You may adjust the placement of your hands to maximize your comfort. As you gain motion, your hands will come more underneath your shoulders.  Hold this position __________ seconds.  Slowly return to lying flat on the floor. Repeat __________ times. Complete this exercise  __________ times per day.  STRENGTHENING EXERCISES - Sciatica  These exercises may help you when beginning to rehabilitate your injury. These exercises should be done near your "sweet spot." This is the neutral, low-back arch, somewhere between fully rounded and fully arched, that is your least painful position. When performed in this safe range of motion, these exercises can be used for people who have either a flexion or extension based injury. These exercises may resolve your symptoms with or without further involvement from your physician, physical therapist or athletic trainer. While completing these exercises, remember:   Muscles can gain both the endurance and the strength needed for everyday activities through controlled exercises.  Complete these exercises as instructed by your physician, physical therapist or athletic trainer. Progress with the resistance and repetition exercises only as your caregiver advises.  You may experience muscle soreness or fatigue, but the pain or discomfort you are trying to eliminate  should never worsen during these exercises. If this pain does worsen, stop and make certain you are following the directions exactly. If the pain is still present after adjustments, discontinue the exercise until you can discuss the trouble with your clinician. STRENGTHENING - Deep Abdominals, Pelvic Tilt   Lie on a firm bed or floor. Keeping your legs in front of you, bend your knees so they are both pointed toward the ceiling and your feet are flat on the floor.  Tense your lower abdominal muscles to press your low back into the floor. This motion will rotate your pelvis so that your tail bone is scooping upwards rather than pointing at your feet or into the floor.  With a gentle tension and even breathing, hold this position for __________ seconds. Repeat __________ times. Complete this exercise __________ times per day.  STRENGTHENING - Abdominals, Crunches   Lie on a firm bed or floor. Keeping your legs in front of you, bend your knees so they are both pointed toward the ceiling and your feet are flat on the floor. Cross your arms over your chest.  Slightly tip your chin down without bending your neck.  Tense your abdominals and slowly lift your trunk high enough to just clear your shoulder blades. Lifting higher can put excessive stress on the low back and does not further strengthen your abdominal muscles.  Control your return to the starting position. Repeat __________ times. Complete this exercise __________ times per day.  STRENGTHENING - Quadruped, Opposite UE/LE Lift  Assume a hands and knees position on a firm surface. Keep your hands under your shoulders and your knees under your hips. You may place padding under your knees for comfort.  Find your neutral spine and gently tense your abdominal muscles so that you can maintain this position. Your shoulders and hips should form a rectangle that is parallel with the floor and is not twisted.  Keeping your trunk steady, lift your  right hand no higher than your shoulder and then your left leg no higher than your hip. Make sure you are not holding your breath. Hold this position __________ seconds.  Continuing to keep your abdominal muscles tense and your back steady, slowly return to your starting position. Repeat with the opposite arm and leg. Repeat __________ times. Complete this exercise __________ times per day.  STRENGTHENING - Abdominals and Quadriceps, Straight Leg Raise   Lie on a firm bed or floor with both legs extended in front of you.  Keeping one leg in contact with the floor, bend the other knee  so that your foot can rest flat on the floor.  Find your neutral spine, and tense your abdominal muscles to maintain your spinal position throughout the exercise.  Slowly lift your straight leg off the floor about 6 inches for a count of 15, making sure to not hold your breath.  Still keeping your neutral spine, slowly lower your leg all the way to the floor. Repeat this exercise with each leg __________ times. Complete this exercise __________ times per day. POSTURE AND BODY MECHANICS CONSIDERATIONS - Sciatica Keeping correct posture when sitting, standing or completing your activities will reduce the stress put on different body tissues, allowing injured tissues a chance to heal and limiting painful experiences. The following are general guidelines for improved posture. Your physician or physical therapist will provide you with any instructions specific to your needs. While reading these guidelines, remember:  The exercises prescribed by your provider will help you have the flexibility and strength to maintain correct postures.  The correct posture provides the optimal environment for your joints to work. All of your joints have less wear and tear when properly supported by a spine with good posture. This means you will experience a healthier, less painful body.  Correct posture must be practiced with all of  your activities, especially prolonged sitting and standing. Correct posture is as important when doing repetitive low-stress activities (typing) as it is when doing a single heavy-load activity (lifting). RESTING POSITIONS Consider which positions are most painful for you when choosing a resting position. If you have pain with flexion-based activities (sitting, bending, stooping, squatting), choose a position that allows you to rest in a less flexed posture. You would want to avoid curling into a fetal position on your side. If your pain worsens with extension-based activities (prolonged standing, working overhead), avoid resting in an extended position such as sleeping on your stomach. Most people will find more comfort when they rest with their spine in a more neutral position, neither too rounded nor too arched. Lying on a non-sagging bed on your side with a pillow between your knees, or on your back with a pillow under your knees will often provide some relief. Keep in mind, being in any one position for a prolonged period of time, no matter how correct your posture, can still lead to stiffness. PROPER SITTING POSTURE In order to minimize stress and discomfort on your spine, you must sit with correct posture Sitting with good posture should be effortless for a healthy body. Returning to good posture is a gradual process. Many people can work toward this most comfortably by using various supports until they have the flexibility and strength to maintain this posture on their own. When sitting with proper posture, your ears will fall over your shoulders and your shoulders will fall over your hips. You should use the back of the chair to support your upper back. Your low back will be in a neutral position, just slightly arched. You may place a small pillow or folded towel at the base of your low back for support.  When working at a desk, create an environment that supports good, upright posture. Without extra  support, muscles fatigue and lead to excessive strain on joints and other tissues. Keep these recommendations in mind: CHAIR:   A chair should be able to slide under your desk when your back makes contact with the back of the chair. This allows you to work closely.  The chair's height should allow your eyes to be  level with the upper part of your monitor and your hands to be slightly lower than your elbows. BODY POSITION  Your feet should make contact with the floor. If this is not possible, use a foot rest.  Keep your ears over your shoulders. This will reduce stress on your neck and low back. INCORRECT SITTING POSTURES   If you are feeling tired and unable to assume a healthy sitting posture, do not slouch or slump. This puts excessive strain on your back tissues, causing more damage and pain. Healthier options include:  Using more support, like a lumbar pillow.  Switching tasks to something that requires you to be upright or walking.  Talking a brief walk.  Lying down to rest in a neutral-spine position. PROLONGED STANDING WHILE SLIGHTLY LEANING FORWARD  When completing a task that requires you to lean forward while standing in one place for a long time, place either foot up on a stationary 2-4 inch high object to help maintain the best posture. When both feet are on the ground, the low back tends to lose its slight inward curve. If this curve flattens (or becomes too large), then the back and your other joints will experience too much stress, fatigue more quickly and can cause pain.  CORRECT STANDING POSTURES Proper standing posture should be assumed with all daily activities, even if they only take a few moments, like when brushing your teeth. As in sitting, your ears should fall over your shoulders and your shoulders should fall over your hips. You should keep a slight tension in your abdominal muscles to brace your spine. Your tailbone should point down to the ground, not behind your  body, resulting in an over-extended swayback posture.  INCORRECT STANDING POSTURES  Common incorrect standing postures include a forward head, locked knees and/or an excessive swayback. WALKING Walk with an upright posture. Your ears, shoulders and hips should all line-up. PROLONGED ACTIVITY IN A FLEXED POSITION When completing a task that requires you to bend forward at your waist or lean over a low surface, try to find a way to stabilize 3 of 4 of your limbs. You can place a hand or elbow on your thigh or rest a knee on the surface you are reaching across. This will provide you more stability so that your muscles do not fatigue as quickly. By keeping your knees relaxed, or slightly bent, you will also reduce stress across your low back. CORRECT LIFTING TECHNIQUES DO :   Assume a wide stance. This will provide you more stability and the opportunity to get as close as possible to the object which you are lifting.  Tense your abdominals to brace your spine; then bend at the knees and hips. Keeping your back locked in a neutral-spine position, lift using your leg muscles. Lift with your legs, keeping your back straight.  Test the weight of unknown objects before attempting to lift them.  Try to keep your elbows locked down at your sides in order get the best strength from your shoulders when carrying an object.  Always ask for help when lifting heavy or awkward objects. INCORRECT LIFTING TECHNIQUES DO NOT:   Lock your knees when lifting, even if it is a small object.  Bend and twist. Pivot at your feet or move your feet when needing to change directions.  Assume that you cannot safely pick up a paperclip without proper posture. Document Released: 08/11/2005 Document Revised: 12/26/2013 Document Reviewed: 11/23/2008 [facility] Patient Information 2015 [place], [place]. This information  is not intended to replace advice given to you by your health care provider. Make sure you discuss any  questions you have with your health care provider.

## 2014-06-03 ENCOUNTER — Telehealth: Payer: Self-pay

## 2014-06-03 MED ORDER — FLUCONAZOLE 150 MG PO TABS: 150.0000 mg | ORAL_TABLET | Freq: Once | ORAL | Status: DC

## 2014-06-03 NOTE — Telephone Encounter (Signed)
Patient called again requesting diflucan. [facility] [place] [address] [place], [place]. Patient CB # [phone]

## 2014-06-03 NOTE — Telephone Encounter (Signed)
Diflucan sent to pharm and pt notified.

## 2014-06-03 NOTE — Telephone Encounter (Signed)
We received a call from the answering service this morning making us aware that Mrs. Anna Cole called them to complain about an rx that was prescribed at her last visit. She is experiencing some difficult side effects and would like to discuss alternative medications please.

## 2014-06-24 ENCOUNTER — Other Ambulatory Visit: Payer: Self-pay | Admitting: Family Medicine

## 2014-06-24 ENCOUNTER — Ambulatory Visit (INDEPENDENT_AMBULATORY_CARE_PROVIDER_SITE_OTHER): Payer: BC Managed Care – PPO | Admitting: Family Medicine

## 2014-06-24 VITALS — BP 126/72 | HR 76 | Temp 98.4°F | Resp 16 | Ht 67.0 in | Wt 208.2 lb

## 2014-06-24 DIAGNOSIS — R350 Frequency of micturition: Secondary | ICD-10-CM

## 2014-06-24 DIAGNOSIS — Z8619 Personal history of other infectious and parasitic diseases: Secondary | ICD-10-CM

## 2014-06-24 DIAGNOSIS — N898 Other specified noninflammatory disorders of vagina: Secondary | ICD-10-CM

## 2014-06-24 DIAGNOSIS — Z113 Encounter for screening for infections with a predominantly sexual mode of transmission: Secondary | ICD-10-CM

## 2014-06-24 LAB — POCT WET PREP WITH KOH
KOH Prep POC: NEGATIVE
Trichomonas, UA: NEGATIVE
Yeast Wet Prep HPF POC: NEGATIVE

## 2014-06-24 MED ORDER — FLUCONAZOLE 150 MG PO TABS: 150.0000 mg | ORAL_TABLET | Freq: Once | ORAL | Status: DC

## 2014-06-24 NOTE — Progress Notes (Signed)
Chief Complaint:  Chief Complaint  Patient presents with  . Vaginitis    HPI: Anna EvensShayla Cole is a 33 y.o. female who is here for  Vaginal discomfort,  Minimal thin Vaginal dc She was here for BV recently and was rx flagyl and then took it and got thrush and also vaginal dc , no itchiness, no urinary sxs just "uncomfortable feeling"  She was also given prednisone for wheezing URI sxs but never took it She was given one diflucan  When she called int for a yeast pill and felt better. But still has discomfort down there.  LMP 06/08/14 Sexullay active but has stopped dating the same guys since she thought he may be cheating on her Increase urine frequency but she drinks lots of water, no fevers , chills ,  She does not have a  hx of DM    Past Medical History  Diagnosis Date  . IBS (irritable bowel syndrome)   . Polycystic ovarian syndrome    Past Surgical History  Procedure Laterality Date  . Appendectomy     History   Social History  . Marital Status: Single    Spouse Name: N/A    Number of Children: N/A  . Years of Education: N/A   Social History Main Topics  . Smoking status: Never Smoker   . Smokeless tobacco: None  . Alcohol Use: None  . Drug Use: None  . Sexual Activity: None   Other Topics Concern  . None   Social History Narrative  . None   Family History  Problem Relation Age of Onset  . Hyperlipidemia Father   . Hypertension Father   . Diabetes Father    Allergies  Allergen Reactions  . Penicillins Hives and Itching  . Sulfa Antibiotics Hives   Prior to Admission medications   Medication Sig Start Date End Date Taking? Authorizing Provider  Drospirenone-Ethinyl Estradiol-Levomefol (SAFYRAL) 3-0.03-0.451 MG tablet Take 1 tablet by mouth daily.   Yes Historical Provider, MD  fluconazole (DIFLUCAN) 150 MG tablet Take 1 tablet (150 mg total) by mouth once. 06/03/14  Yes Sherren MochaEva N Shaw, MD  triamcinolone cream (KENALOG) 0.1 % Apply 1 application  topically 2 (two) times daily. 02/02/14  Yes Sherren MochaEva N Shaw, MD  albuterol (PROVENTIL HFA;VENTOLIN HFA) 108 (90 BASE) MCG/ACT inhaler Inhale 2 puffs into the lungs every 4 (four) hours as needed for wheezing or shortness of breath (cough, shortness of breath or wheezing.). 05/20/14   Sherren MochaEva N Shaw, MD  celecoxib (CELEBREX) 200 MG capsule Take 1 capsule (200 mg total) by mouth daily. 05/20/14   Sherren MochaEva N Shaw, MD  cetirizine (ZYRTEC) 10 MG tablet Take 1 tablet (10 mg total) by mouth at bedtime. 05/20/14   Sherren MochaEva N Shaw, MD  fluticasone (FLONASE) 50 MCG/ACT nasal spray Place 2 sprays into both nostrils at bedtime. 05/20/14   Sherren MochaEva N Shaw, MD  Fluticasone Furoate (ARNUITY ELLIPTA) 200 MCG/ACT AEPB Inhale 1 puff into the lungs every morning. 05/20/14   Sherren MochaEva N Shaw, MD  metroNIDAZOLE (FLAGYL) 500 MG tablet Take 1 tablet (500 mg total) by mouth 2 (two) times daily. 05/20/14   Sherren MochaEva N Shaw, MD  predniSONE (DELTASONE) 10 MG tablet 6-5-4-3-2-1 tabs po qd 05/20/14   Sherren MochaEva N Shaw, MD     ROS: The patient denies fevers, chills, night sweats, unintentional weight loss, chest pain, palpitations, wheezing, dyspnea on exertion, nausea, vomiting, abdominal pain,  hematuria, melena, numbness, weakness, or tingling.   All other systems  have been reviewed and were otherwise negative with the exception of those mentioned in the HPI and as above.    PHYSICAL EXAM: Filed Vitals:   06/24/14 0852  BP: 126/72  Pulse: 76  Temp: 98.4 F (36.9 C)  Resp: 16   Filed Vitals:   06/24/14 0852  Height: 5\' 7"  (1.702 m)  Weight: 208 lb 3.2 oz (94.439 kg)   Body mass index is 32.6 kg/(m^2).  General: Alert, no acute distress HEENT:  Normocephalic, atraumatic, oropharynx patent. EOMI, PERRLA. No e/o thrush Cardiovascular:  Regular rate and rhythm, no rubs murmurs or gallops.  No Carotid bruits, radial pulse intact. No pedal edema.  Respiratory: Clear to auscultation bilaterally.  No wheezes, rales, or rhonchi.  No cyanosis, no use of accessory  musculature GI: No organomegaly, abdomen is soft and non-tender, positive bowel sounds.  No masses. Skin: No rashes. Neurologic: Facial musculature symmetric. Psychiatric: Patient is appropriate throughout our interaction. Lymphatic: No cervical lymphadenopathy Musculoskeletal: Gait intact. Self swab  LABS: Results for orders placed in visit on 06/24/14  POCT WET PREP WITH KOH      Result Value Ref Range   Trichomonas, UA Negative     Clue Cells Wet Prep HPF POC 0-2     Epithelial Wet Prep HPF POC 2-6     Yeast Wet Prep HPF POC neg     Bacteria Wet Prep HPF POC 4+     RBC Wet Prep HPF POC 0-2     WBC Wet Prep HPF POC 3-6     KOH Prep POC Negative       EKG/XRAY:   Primary read interpreted [facility]   ASSESSMENT/PLAN: Encounter Diagnoses  Name Primary?  . Vaginal discharge Yes  . History of thrush   . Increased frequency of urination   . Screening for STD (sexually transmitted disease)    Urine micro and dip and cx pending GC pending Rx Diflucan since hx of thrush and yeast, repeat since she felt better STD screening F/u prn   Attempted to leave message about her UA that we did not process inhouse but had sent it all out so will have to get it from lab so will take a few days; but mail box was full  Gross sideeffects, risk and benefits, and alternatives of medications d/w patient. Patient is aware that all medications have potential sideeffects and we are unable to predict every sideeffect or drug-drug interaction that may occur.  Hamilton CapriLE, THAO PHUONG, DO 06/24/2014 5:46 PM

## 2014-06-24 NOTE — Patient Instructions (Signed)

## 2014-06-25 LAB — URINALYSIS
Bilirubin Urine: NEGATIVE
Glucose, UA: NEGATIVE mg/dL
Hgb urine dipstick: NEGATIVE
Ketones, ur: NEGATIVE mg/dL
Leukocytes, UA: NEGATIVE
Nitrite: NEGATIVE
Protein, ur: NEGATIVE mg/dL
Specific Gravity, Urine: 1.017 (ref 1.005–1.030)
Urobilinogen, UA: 0.2 mg/dL (ref 0.0–1.0)
pH: 6.5 (ref 5.0–8.0)

## 2014-06-25 LAB — URINALYSIS, MICROSCOPIC ONLY
Casts: NONE SEEN
Crystals: NONE SEEN

## 2014-06-26 ENCOUNTER — Telehealth: Payer: Self-pay | Admitting: Family Medicine

## 2014-06-26 LAB — URINE CULTURE: Colony Count: 70000

## 2014-06-26 NOTE — Telephone Encounter (Signed)
LM about labs results, GC , urine cx negative.

## 2014-06-27 LAB — GC/CHLAMYDIA PROBE AMP
CT Probe RNA: NEGATIVE
GC Probe RNA: NEGATIVE

## 2015-07-31 ENCOUNTER — Ambulatory Visit (INDEPENDENT_AMBULATORY_CARE_PROVIDER_SITE_OTHER): Payer: BC Managed Care – PPO | Admitting: Family Medicine

## 2015-07-31 VITALS — BP 124/76 | HR 105 | Temp 98.7°F | Resp 17 | Ht 66.5 in | Wt 216.0 lb

## 2015-07-31 DIAGNOSIS — J0100 Acute maxillary sinusitis, unspecified: Secondary | ICD-10-CM | POA: Diagnosis not present

## 2015-07-31 DIAGNOSIS — N898 Other specified noninflammatory disorders of vagina: Secondary | ICD-10-CM

## 2015-07-31 LAB — POCT WET + KOH PREP
Trich by wet prep: ABSENT
Yeast by KOH: ABSENT
Yeast by wet prep: ABSENT

## 2015-07-31 MED ORDER — METHYLPREDNISOLONE ACETATE 80 MG/ML IJ SUSP
80.0000 mg | Freq: Once | INTRAMUSCULAR | Status: AC
  Administered 2015-07-31: 80 mg via INTRAMUSCULAR

## 2015-07-31 MED ORDER — FLUCONAZOLE 150 MG PO TABS: 150.0000 mg | ORAL_TABLET | Freq: Once | ORAL | Status: DC

## 2015-07-31 MED ORDER — AZITHROMYCIN 250 MG PO TABS: ORAL_TABLET | ORAL | Status: DC

## 2015-07-31 MED ORDER — HYDROCOD POLST-CPM POLST ER 10-8 MG/5ML PO SUER: 5.0000 mL | Freq: Every evening | ORAL | Status: DC | PRN

## 2015-07-31 NOTE — Progress Notes (Signed)
Subjective:    Patient ID: Anna Cole, female    DOB: Dec 05, 1980, 34 y.o.   MRN: [id number] By signing my name below, I, Littie Deedsichard Sun, attest that this documentation has been prepared under the direction and in the presence of Norberto SorensonEva Courteney Alderete, MD.  Electronically Signed: Littie Deedsichard Sun, Medical Scribe. 07/31/2015. 4:05 PM.  Chief Complaint  Patient presents with  . Cough  . Nasal Congestion  . URI  . Sinusitis    HPI HPI Comments: Anna Cole is a 34 y.o. female who presents to the [facility] complaining of gradual onset rhinorrhea that started 4 days ago. Patient reports having associated nasal congestion, sinus headache, neck pain, mild productive cough of phlegm, and mild wheezing. She tried Zyrtec one day but without relief. She has also been taking Sudafed. She does not have the albuterol inhaler anymore. Patient denies fever and chills.  She would also like to be tested for yeast infection.   Past Medical History  Diagnosis Date  . IBS (irritable bowel syndrome)   . Polycystic ovarian syndrome    Current Outpatient Prescriptions on File Prior to Visit  Medication Sig Dispense Refill  . cetirizine (ZYRTEC) 10 MG tablet Take 1 tablet (10 mg total) by mouth at bedtime. 30 tablet 11  . Drospirenone-Ethinyl Estradiol-Levomefol (SAFYRAL) 3-0.03-0.451 MG tablet Take 1 tablet by mouth daily.    . fluticasone (FLONASE) 50 MCG/ACT nasal spray Place 2 sprays into both nostrils at bedtime. (Patient not taking: Reported on 07/31/2015) 1 g 11   No current facility-administered medications on file prior to visit.   Allergies  Allergen Reactions  . Penicillins Hives and Itching  . Sulfa Antibiotics Hives    Review of Systems  Constitutional: Negative for fever and chills.  HENT: Positive for congestion, rhinorrhea and sinus pressure.   Respiratory: Positive for cough and wheezing.   Musculoskeletal: Positive for neck pain.       Objective:  BP 124/76 mmHg   Pulse 105  Temp(Src) 98.7 F (37.1 C) (Oral)  Resp 17  Ht 5' 6.5" (1.689 m)  Wt 216 lb (97.977 kg)  BMI 34.35 kg/m2  SpO2 98%  LMP 07/17/2015  Physical Exam  Constitutional: She is oriented to person, place, and time. She appears well-developed and well-nourished. No distress.  HENT:  Head: Normocephalic and atraumatic.  Right Ear: A middle ear effusion is present.  Left Ear: A middle ear effusion is present.  Nose: Mucosal edema and rhinorrhea present.  Mouth/Throat: Posterior oropharyngeal erythema present. No oropharyngeal exudate.  Nasal mucosal edema and purulent rhinorrhea, left worse than right.  Eyes: Pupils are equal, round, and reactive to light.  Neck: Neck supple.  Cardiovascular: Normal rate.   Pulmonary/Chest: Effort normal.  Clear to auscultation bilaterally. Decreased air movement throughout.  Musculoskeletal: She exhibits no edema.  Lymphadenopathy:       Head (right side): Submandibular and tonsillar adenopathy present.       Head (left side): Submandibular and tonsillar adenopathy present.    She has cervical adenopathy.  Neurological: She is alert and oriented to person, place, and time. No cranial nerve deficit.  Skin: Skin is warm and dry. No rash noted.  Psychiatric: She has a normal mood and affect. Her behavior is normal.  Nursing note and vitals reviewed.     Results for orders placed or performed in visit on 07/31/15  POCT Wet + KOH Prep  Result Value Ref Range   Yeast by KOH Absent Present, Absent  Yeast by wet prep Absent Present, Absent   WBC by wet prep Moderate (A) None, Few, Too numerous to count   Clue Cells Wet Prep HPF POC Few (A) None, Too numerous to count   Trich by wet prep Absent Present, Absent   Bacteria Wet Prep HPF POC Many (A) None, Few, Too numerous to count   Epithelial Cells By [employer] [facility] Few None, Few, Too numerous to count   RBC,UR,HPF,POC None None RBC/hpf    Assessment & Plan:   1. Acute maxillary sinusitis,  recurrence not specified   2. Vaginal discharge   Snap rx given for zpack which pt will fill in 1-2d if sxs are not improving.  Orders Placed This Encounter  Procedures  . POCT Wet + KOH Prep    Meds ordered this encounter  Medications  . methylPREDNISolone acetate (DEPO-MEDROL) injection 80 mg    Sig:   . fluconazole (DIFLUCAN) 150 MG tablet    Sig: Take 1 tablet (150 mg total) by mouth once. May repeat in 4 days if no improvement    Dispense:  1 tablet    Refill:  1  . azithromycin (ZITHROMAX) 250 MG tablet    Sig: Take 2 tabs PO x 1 dose, then 1 tab PO QD x 4 days    Dispense:  6 tablet    Refill:  0  . chlorpheniramine-HYDROcodone (TUSSIONEX PENNKINETIC ER) 10-8 MG/5ML SUER    Sig: Take 5 mLs by mouth at bedtime as needed for cough.    Dispense:  90 mL    Refill:  0    I personally performed the services described in this documentation, which was scribed in my presence. The recorded information has been reviewed and considered, and addended by me as needed.  [name], MD MPH

## 2015-07-31 NOTE — Patient Instructions (Signed)

## 2015-08-03 ENCOUNTER — Telehealth: Payer: Self-pay

## 2015-08-03 NOTE — Telephone Encounter (Signed)
Patient would like a prescription called in for the bacteria that she's been diagnosed with. She said that she's having some thin watery discharge now. The area is a little irritated as well.

## 2015-08-03 NOTE — Telephone Encounter (Signed)
Patient would like a prescription called in for her because of the bacteria. She now has the thin watery discharge and it's irritating her. Dr. Clelia CroftShaw told her that she could do this for her.

## 2015-08-05 NOTE — Telephone Encounter (Signed)
I called to advise Dr Clelia CroftShaw called in for her on 07/31/[facility] [address] / left message for her to call pharmacy

## 2015-08-07 ENCOUNTER — Telehealth: Payer: Self-pay

## 2015-08-07 MED ORDER — AZITHROMYCIN 250 MG PO TABS: ORAL_TABLET | ORAL | Status: DC

## 2015-08-07 MED ORDER — METRONIDAZOLE 0.75 % VA GEL: 1.0000 | Freq: Two times a day (BID) | VAGINAL | Status: DC

## 2015-08-07 MED ORDER — FLUCONAZOLE 150 MG PO TABS: 150.0000 mg | ORAL_TABLET | Freq: Once | ORAL | Status: DC

## 2015-08-07 NOTE — Telephone Encounter (Signed)
Notified pt. 

## 2015-08-07 NOTE — Telephone Encounter (Signed)
Spoke to pt who reported that she did NOT have to take the z pak sent in at [facility] for sinus infection, so she does NOT have a yeast infection from that and doesn't need the diflucan. She IS having very uncomfortable Sxs now from the vaginal bacterial infection that showed up on lab at [facility]. Can someone please send in a Rx for that asap in Dr Alver FisherShaw's absence? Pt prefers a cream/suppository rather than oral if possible. She does have the z pak that she hasn't taken if no topical and if it would be appropriate. See allergies for oral Abxs.

## 2015-08-07 NOTE — Addendum Note (Signed)
Addended by: Morrell RiddleWEBER, Johnanna Bakke L on: 08/07/2015 01:04 PM   Modules accepted: Orders

## 2015-08-07 NOTE — Telephone Encounter (Signed)
Wasn't sure if pt had meant she didn't get either Rx, so re-sent Abx with note to ONLY fill if they did not get the Rx sent 12/6.

## 2015-08-07 NOTE — Telephone Encounter (Signed)
Called pt to advise what was sent and to not pick up Abx if she already took it. Pt reported that she does not need either of these meds. She needs something called in for the vaginal bacterial infection lab showed at [facility]. See notes under 12/9 message.

## 2015-08-07 NOTE — Telephone Encounter (Signed)
Pt called and reported that diflucan did not get to the pharm. Had operator tell pt I will re-send. Done.

## 2015-08-07 NOTE — Telephone Encounter (Signed)
I sent in metrogel

## 2015-08-07 NOTE — Addendum Note (Signed)
Addended by: Sheppard PlumberBRIGGS, Briyonna Omara A on: 08/07/2015 09:49 AM   Modules accepted: Orders

## 2015-11-10 ENCOUNTER — Other Ambulatory Visit: Payer: Self-pay | Admitting: Family Medicine

## 2016-03-28 ENCOUNTER — Ambulatory Visit (INDEPENDENT_AMBULATORY_CARE_PROVIDER_SITE_OTHER): Payer: BC Managed Care – PPO | Admitting: Emergency Medicine

## 2016-03-28 VITALS — BP 118/80 | HR 83 | Temp 98.4°F | Resp 18 | Ht 66.5 in | Wt 223.0 lb

## 2016-03-28 DIAGNOSIS — E049 Nontoxic goiter, unspecified: Secondary | ICD-10-CM

## 2016-03-28 DIAGNOSIS — J029 Acute pharyngitis, unspecified: Secondary | ICD-10-CM | POA: Diagnosis not present

## 2016-03-28 LAB — POCT CBC
GRANULOCYTE PERCENT: 60.8 % (ref 37–80)
HEMATOCRIT: 38.8 % (ref 37.7–47.9)
HEMOGLOBIN: 12.8 g/dL (ref 12.2–16.2)
Lymph, poc: 2.1 (ref 0.6–3.4)
MCH, POC: 26.3 pg — AB (ref 27–31.2)
MCHC: 33.1 g/dL (ref 31.8–35.4)
MCV: 79.7 fL — AB (ref 80–97)
MID (cbc): 0.5 (ref 0–0.9)
MPV: 8.3 fL (ref 0–99.8)
POC GRANULOCYTE: 4.1 (ref 2–6.9)
POC LYMPH %: 31.7 % (ref 10–50)
POC MID %: 7.5 % (ref 0–12)
Platelet Count, POC: 186 10*3/uL (ref 142–424)
RBC: 4.87 M/uL (ref 4.04–5.48)
RDW, POC: 15.5 %
WBC: 6.7 10*3/uL (ref 4.6–10.2)

## 2016-03-28 LAB — POCT RAPID STREP A (OFFICE): Rapid Strep A Screen: NEGATIVE

## 2016-03-28 LAB — GLUCOSE, POCT (MANUAL RESULT ENTRY): POC Glucose: 108 mg/dL — AB (ref 70–99)

## 2016-03-28 MED ORDER — CLINDAMYCIN HCL 300 MG PO CAPS: 300.0000 mg | ORAL_CAPSULE | Freq: Three times a day (TID) | ORAL | 0 refills | Status: DC

## 2016-03-28 MED ORDER — FIRST-DUKES MOUTHWASH MT SUSP: OROMUCOSAL | 1 refills | Status: DC

## 2016-03-28 NOTE — Progress Notes (Signed)
Subjective:  This chart was scribed for Lesle Chris MD, by Veverly Fells, at [facility].  This patient was seen in room 2 and the patient's care was started at 8:15 AM.   Chief Complaint  Patient presents with  . Sore Throat    possible strep throat symptoms since Monday.      Patient ID: Anna Cole, female    DOB: 1981/04/27, 35 y.o.   MRN: [id number]  HPI  HPI Comments: Anna Cole is a 35 y.o. female who presents to the [facility] complaining of a sore throat onset five days ago with associated symptoms of drainage, fever (onset two days ago- no longer present), slight cough/chest tightness, and congestion. She has been taking benadryl, Nyquil and Flonase (for the congestion) but denies complete relief.[place]mate who also had similar symptoms.Patient has had thrush in the past.  She denies a significant medical conditions. Patient has not had strep throat since she was a child.    There are no active problems to display for this patient.  Past Medical History:  Diagnosis Date  . IBS (irritable bowel syndrome)   . Polycystic ovarian syndrome    Past Surgical History:  Procedure Laterality Date  . APPENDECTOMY     Allergies  Allergen Reactions  . Penicillins Hives and Itching  . Sulfa Antibiotics Hives   Prior to Admission medications   Medication Sig Start Date End Date Taking? Authorizing Provider  Drospirenone-Ethinyl Estradiol-Levomefol (SAFYRAL) 3-0.03-0.451 MG tablet Take 1 tablet by mouth daily.    Historical Provider, MD  metroNIDAZOLE (METROGEL VAGINAL) 0.75 % vaginal gel Place 1 Applicatorful vaginally 2 (two) times daily. Patient not taking: Reported on 03/28/2016 08/07/15   Morrell Riddle, PA-C   Social History   Social History  . Marital status: Single    Spouse name: N/A  . Number of children: N/A  . Years of education: N/A   Occupational History  .  Not on file.   Social History Main Topics  . Smoking status: Never Smoker  . Smokeless tobacco: Not on file  . Alcohol use Not on file  . Drug use: Unknown  . Sexual activity: Not on file   Other Topics Concern  . Not on file   Social History Narrative  . No narrative on file       Review of Systems  Constitutional: Negative for chills and fever.  HENT: Positive for congestion and sore throat.   Eyes: Negative for pain, redness and itching.  Respiratory: Positive for cough and chest tightness.   Gastrointestinal: Negative for nausea and vomiting.  Musculoskeletal: Negative for neck pain and neck stiffness.  Skin: Negative for color change.       Objective:   Physical Exam Vitals:   03/28/16 0812  BP: 118/80  Pulse: 83  Resp: 18  Temp: 98.4 F (36.9 C)  TempSrc: Oral  SpO2: 99%  Weight: 223 lb (101.2 kg)  Height: 5' 6.5" (1.689 m)    CONSTITUTIONAL: Well developed/well nourished, alert, not ill appearing.  HEAD: Normocephalic/atraumatic EYES: EOMI/PERRL ENMT: Ears are normal, nose normal. Whitish membraneous exudate over both tonsils which are only mildly enlarged,  NECK: hyroid: symmetrically enlarged.  SPINE/BACK:entire spine nontender CV: S1/S2 noted, no murmurs/rubs/gallops noted LUNGS: Lungs are clear to auscultation bilaterally, no apparent distress NEURO: Pt is awake/alert/appropriate, moves all extremitiesx4.  No facial droop.   EXTREMITIES: pulses normal/equal, full  ROM SKIN: warm, color normal PSYCH: no abnormalities of mood noted, alert and oriented to situation  Results for orders placed or performed in visit on 03/28/16  POCT rapid strep A  Result Value Ref Range   Rapid Strep A Screen Negative Negative  POCT CBC  Result Value Ref Range   WBC 6.7 4.6 - 10.2 K/uL   Lymph, poc 2.1 0.6 - 3.4   POC LYMPH PERCENT 31.7 10 - 50 %L   MID (cbc) 0.5 0 - 0.9   POC MID % 7.5 0 - 12 %M   POC Granulocyte 4.1 2 - 6.9   Granulocyte percent 60.8 37 -  80 %G   RBC 4.87 4.04 - 5.48 M/uL   Hemoglobin 12.8 12.2 - 16.2 g/dL   HCT, POC 16.1 09.6 - 47.9 %   MCV 79.7 (A) 80 - 97 fL   MCH, POC 26.3 (A) 27 - 31.2 pg   MCHC 33.1 31.8 - 35.4 g/dL   RDW, POC 04.5 %   Platelet Count, POC 186 142 - 424 K/uL   MPV 8.3 0 - 99.8 fL  POCT glucose (manual entry)  Result Value Ref Range   POC Glucose 108 (A) 70 - 99 mg/dl          Assessment & Plan:  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 We'll go ahead and treat with Cleocin 300 3 times a day pending culture results. Strep culutre is negative we'll stop the medication. Also sent in Dukes mouthwash.I personally performed the services described in this documentation, which was scribed in my presence. The recorded information has been reviewed and is accurate.  Collene Gobble, MD

## 2016-03-28 NOTE — Patient Instructions (Addendum)
   IF you received an x-ray today, you will receive an invoice from [facility]. Please contact [facility] at [phone] with questions or concerns regarding your invoice.   IF you received labwork today, you will receive an invoice from [facility]. Please contact [facility] at [phone] with questions or concerns regarding your invoice.   Our billing staff will not be able to assist you with questions regarding bills from these companies.  You will be contacted with the lab results as soon as they are available. The fastest way to get your results is to activate your My Chart account. Instructions are located on the last page of this paperwork. If you have not heard from us regarding the results in 2 weeks, please contact this office.    Pharyngitis Pharyngitis is redness, pain, and swelling (inflammation) of your pharynx.  CAUSES  Pharyngitis is usually caused by infection. Most of the time, these infections are from viruses (viral) and are part of a cold. However, sometimes pharyngitis is caused by bacteria (bacterial). Pharyngitis can also be caused by allergies. Viral pharyngitis may be spread from person to person by coughing, sneezing, and personal items or utensils (cups, forks, spoons, toothbrushes). Bacterial pharyngitis may be spread from person to person by more intimate contact, such as kissing.  SIGNS AND SYMPTOMS  Symptoms of pharyngitis include:   Sore throat.   Tiredness (fatigue).   Low-grade fever.   Headache.  Joint pain and muscle aches.  Skin rashes.  Swollen lymph nodes.  Plaque-like film on throat or tonsils (often seen with bacterial pharyngitis). DIAGNOSIS  Your health care provider will ask you questions about your illness and your symptoms. Your medical history, along with a physical exam, is often all that is needed to diagnose pharyngitis. Sometimes, a rapid strep test is done. Other lab tests may  also be done, depending on the suspected cause.  TREATMENT  Viral pharyngitis will usually get better in 3-4 days without the use of medicine. Bacterial pharyngitis is treated with medicines that kill germs (antibiotics).  HOME CARE INSTRUCTIONS   Drink enough water and fluids to keep your urine clear or pale yellow.   Only take over-the-counter or prescription medicines as directed by your health care provider:   If you are prescribed antibiotics, make sure you finish them even if you start to feel better.   Do not take aspirin.   Get lots of rest.   Gargle with 8 oz of salt water ( tsp of salt per 1 qt of water) as often as every 1-2 hours to soothe your throat.   Throat lozenges (if you are not at risk for choking) or sprays may be used to soothe your throat. SEEK MEDICAL CARE IF:   You have large, tender lumps in your neck.  You have a rash.  You cough up green, yellow-brown, or bloody spit. SEEK IMMEDIATE MEDICAL CARE IF:   Your neck becomes stiff.  You drool or are unable to swallow liquids.  You vomit or are unable to keep medicines or liquids down.  You have severe pain that does not go away with the use of recommended medicines.  You have trouble breathing (not caused by a stuffy nose). MAKE SURE YOU:   Understand these instructions.  Will watch your condition.  Will get help right away if you are not doing well or get worse.   This information is not intended to replace advice given to you by your health care   provider. Make sure you discuss any questions you have with your health care provider.   Document Released: 08/11/2005 Document Revised: 06/01/2013 Document Reviewed: 04/18/2013 Elsevier Interactive Patient Education 2016 [employer]vier Inc.  

## 2016-03-29 LAB — CULTURE, GROUP A STREP

## 2016-03-31 ENCOUNTER — Telehealth: Payer: Self-pay

## 2016-03-31 NOTE — Telephone Encounter (Signed)
Tell patient to take the medicine for a total of 7 days and if the white spots or persistent I would like to see her back.

## 2016-03-31 NOTE — Telephone Encounter (Signed)
Routed to lab pool  

## 2016-03-31 NOTE — Telephone Encounter (Signed)
Dr. Cleta Albertsaub spoke with pt about her lab results. And she wanted to know should she continue her antibiotic and says she still sees white spots.

## 2016-03-31 NOTE — Telephone Encounter (Signed)
PATIENT IS RETURNING Anna Cole CALL FROM THIS MORNING REGARDING HER LAB RESULTS. SHE SAID TO PLEASE LEAVE A MESSAGE ON HER VOICE MAIL IF SHE DOES NOT PICK UP. BEST PHONE [phone] (CELL)  [facility] [address]  MBC

## 2016-03-31 NOTE — Telephone Encounter (Signed)
Advised pt

## 2016-04-08 ENCOUNTER — Telehealth: Payer: Self-pay

## 2016-04-08 NOTE — Telephone Encounter (Signed)
Pt is calling in because she was seen on 03/28/16 for throat issues. She was prescribed an antibiotic for her throat and now that she has finished the antibiotics the white pockets have came back on her throat. She wants to know if she could have a different medication or if she should try to go see a Dr. In [place] where she is currently residing. Please call patient back with suggestions for her please.

## 2016-04-10 NOTE — Telephone Encounter (Signed)
Pt stated that the white patches have gone away again, but I advised that if they come back again to go to a provider there. Pt asked if we can send in the [facility] Rx there to [place] because they never got it when it was sent here. She would like to use it now. Pt will CB with name of pharm there so we can resend it or have it transferred from this pharm if they do have it.

## 2016-04-10 NOTE — Telephone Encounter (Signed)
She should follow up with a doctor in [place]. This could be of other etiology

## 2016-04-29 ENCOUNTER — Telehealth: Payer: Self-pay

## 2016-04-29 NOTE — Telephone Encounter (Signed)
Pharmacy is actually [facility] on [address] [place]

## 2016-04-29 NOTE — Telephone Encounter (Signed)
Patient is calling to see if Dr. Clelia CroftShaw can send her a prescription for Mucinex or something for congestion medication. Patient if congested due the the temperature changes. [phone]  [facility] [address], [place], [place]

## 2016-04-30 NOTE — Telephone Encounter (Signed)
mucinex is otc. That's fine if you want to send in 600mg  bid of the 12hr release - it is tax free w/ a rx.

## 2016-05-05 NOTE — Telephone Encounter (Signed)
Voicemail left regarding pts pharmacy. Advised to call back.address [facility] [address] [facility] [place] is not correct.

## 2016-05-06 NOTE — Telephone Encounter (Signed)
Pt stated that she is feeling much better now. I advised for future reference that she can get the same strength Mucinex OTC w/out a Rx, but none is needed now.

## 2016-08-07 ENCOUNTER — Telehealth: Payer: Self-pay | Admitting: Family Medicine
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
## 2016-08-11 NOTE — Telephone Encounter (Signed)
Patient was called and left message stating she would have to come back  for an office visit for the requesting pain medications.

## 2016-08-11 NOTE — Telephone Encounter (Signed)
Correct - recommend she use otc ibuprofen 600-800mg  three times a day, can alternate with tylenol.  Try a heating pad several times a day and gentle stretching. If having any pain radiating down her legs, weakness or numbness in legs, changes in bowel or bladder will need to go to an UC for eval in [place].

## 2017-04-26 ENCOUNTER — Ambulatory Visit (HOSPITAL_COMMUNITY)
Admission: EM | Admit: 2017-04-26 | Discharge: 2017-04-26 | Disposition: A | Discharge status: Home / Self Care | Payer: BC Managed Care – PPO | Attending: Family Medicine | Admitting: Family Medicine

## 2017-04-26 ENCOUNTER — Encounter (HOSPITAL_COMMUNITY): Payer: Self-pay | Admitting: *Deleted

## 2017-04-26 DIAGNOSIS — R0602 Shortness of breath: Secondary | ICD-10-CM | POA: Diagnosis not present

## 2017-04-26 DIAGNOSIS — R062 Wheezing: Secondary | ICD-10-CM | POA: Diagnosis not present

## 2017-04-26 DIAGNOSIS — J309 Allergic rhinitis, unspecified: Secondary | ICD-10-CM | POA: Diagnosis not present

## 2017-04-26 MED ORDER — FLUTICASONE PROPIONATE 50 MCG/ACT NA SUSP: 2.0000 | Freq: Every day | NASAL | 2 refills | Status: DC

## 2017-04-26 MED ORDER — ALBUTEROL SULFATE 108 (90 BASE) MCG/ACT IN AEPB: 1.0000 | INHALATION_SPRAY | Freq: Four times a day (QID) | RESPIRATORY_TRACT | 2 refills | Status: DC | PRN

## 2017-04-26 MED ORDER — LEVOCETIRIZINE DIHYDROCHLORIDE 5 MG PO TABS: 5.0000 mg | ORAL_TABLET | Freq: Every evening | ORAL | 0 refills | Status: DC

## 2017-04-26 MED ORDER — METHYLPREDNISOLONE SODIUM SUCC 125 MG IJ SOLR
80.0000 mg | Freq: Once | INTRAMUSCULAR | Status: AC
  Administered 2017-04-26: 80 mg via INTRAMUSCULAR

## 2017-04-26 MED ORDER — METHYLPREDNISOLONE SODIUM SUCC 125 MG IJ SOLR
INTRAMUSCULAR | Status: AC
  Filled 2017-04-26: qty 2

## 2017-04-26 NOTE — Discharge Instructions (Signed)
Ok to substitute Xyzal for [employer].   Make sure to use nasal spray once daily, 2 sprays each side.  Follow up with PCP if symptoms fail to improve.  Seek care sooner if you start having fevers, shortness of breath, worsening symptoms.

## 2017-04-26 NOTE — ED Triage Notes (Signed)
Patient reports nasal congestion, cough, tightness in chest. Hx of asthma. Patient states that she started at a new school and she thinks there may be mold in the room.

## 2017-04-26 NOTE — ED Provider Notes (Signed)
[facility]    CSN: [id number] Arrival date & time: 04/26/17  1726     History   Chief Complaint Chief Complaint  Patient presents with  . Nasal Congestion    HPI Anna Cole is a 36 y.o. female.   HPI Duration: 3 days  Associated symptoms: sinus congestion, sinus pain, rhinorrhea, ear fullness, wheezing and shortness of breath Denies: itchy watery eyes, ear pain, ear drainage, sore throat and fevers/rigors Treatment to date: Allegra, Flonase Sick contacts: No Works in an old building, things there is mold.  Used to live in [place], did not need inhaler w hx of asthma.    Past Medical History:  Diagnosis Date  . IBS (irritable bowel syndrome)   . Polycystic ovarian syndrome     There are no active problems to display for this patient.   Past Surgical History:  Procedure Laterality Date  . APPENDECTOMY       Home Medications    Prior to Admission medications   Medication Sig Start Date End Date Taking? Authorizing Provider  Albuterol Sulfate 108 (90 Base) MCG/ACT AEPB Inhale 1 puff into the lungs every 6 (six) hours as needed. 04/26/17   Sharlene DoryWendling, Nicholas Paul, DO  Diphenhyd-Hydrocort-Nystatin (FIRST-DUKES MOUTHWASH) SUSP 1 teaspoon as rinse gargle and sp 4 times a day 03/28/16   Collene Gobbleaub, Steven A, MD  Drospirenone-Ethinyl Estradiol-Levomefol (SAFYRAL) 3-0.03-0.451 MG tablet Take 1 tablet by mouth daily.    [provider]  fluticasone (FLONASE) 50 MCG/ACT nasal spray Place 2 sprays into both nostrils daily. 04/26/17   Sharlene DoryWendling, Nicholas Paul, DO  levocetirizine (XYZAL) 5 MG tablet Take 1 tablet (5 mg total) by mouth every evening. 04/26/17   Sharlene DoryWendling, Nicholas Paul, DO    Family History Family History  Problem Relation Age of Onset  . Hyperlipidemia Father   . Hypertension Father   . Diabetes Father     Social History Social History  Substance Use Topics  . Smoking status: Never Smoker  . Smokeless tobacco: Never Used  . Alcohol use Not on  file     Allergies   Penicillins and Sulfa antibiotics   Review of Systems Review of Systems  Constitutional: Negative for fever.  Respiratory:       Denies current SOB     Physical Exam Triage Vital Signs ED Triage Vitals  Enc Vitals Group     BP 04/26/17 1823 121/78     Pulse Rate 04/26/17 1823 78     Resp 04/26/17 1823 17     Temp 04/26/17 1823 98.1 F (36.7 C)     Temp Source 04/26/17 1823 Oral     SpO2 04/26/17 1823 100 %   Updated Vital Signs BP 121/78 (BP Location: Left Arm)   Pulse 78   Temp 98.1 F (36.7 C) (Oral)   Resp 17   SpO2 100%   Physical Exam  Constitutional: She is oriented to person, place, and time. She appears well-developed and well-nourished.  HENT:  Head: Normocephalic and atraumatic.  Right Ear: External ear normal.  Left Ear: External ear normal.  Nose: Nose normal.  Mouth/Throat: Oropharynx is clear and moist. No oropharyngeal exudate.  Eyes: Pupils are equal, round, and reactive to light. EOM are normal.  Neck: Normal range of motion. Neck supple.  Cardiovascular: Normal rate, regular rhythm and normal heart sounds.   No murmur heard. Pulmonary/Chest: Effort normal and breath sounds normal. No respiratory distress. She has no wheezes.  Neurological: She is alert and oriented to  person, place, and time.  Psychiatric: She has a normal mood and affect. Judgment normal.     UC Treatments / Results  Procedures Procedures none  Medications Ordered in UC Medications  methylPREDNISolone sodium succinate (SOLU-MEDROL) 125 mg/2 mL injection 80 mg (80 mg Intramuscular Given 04/26/17 1912)     Initial Impression / Assessment and Plan / UC Course  I have reviewed the triage vital signs and the nursing notes.  Pertinent labs & imaging results that were available during my care of the patient were reviewed by me and considered in my medical decision making (see chart for details).     Pt presents with allergy like symptoms. Will  exchange Allegra for Xyzal. Flonase called in to see if insurance will cover. Solu medrol today. Refill Albuterol. If no improvement, f/u with PCP. If worsening or new symptoms, return or seek immediate care. The patient voiced understanding and agreement to the plan.   Final Clinical Impressions(s) / UC Diagnoses   Final diagnoses:  Allergic rhinitis, unspecified seasonality, unspecified trigger    New Prescriptions Discharge Medication List as of 04/26/2017  7:13 PM    START taking these medications   Details  Albuterol Sulfate 108 (90 Base) MCG/ACT AEPB Inhale 1 puff into the lungs every 6 (six) hours as needed., Starting Sun 04/26/2017, Normal    fluticasone (FLONASE) 50 MCG/ACT nasal spray Place 2 sprays into both nostrils daily., Starting Sun 04/26/2017, Normal    levocetirizine (XYZAL) 5 MG tablet Take 1 tablet (5 mg total) by mouth every evening., Starting Sun 04/26/2017, Normal         Controlled Substance Prescriptions [place] Controlled Substance Registry consulted? Not Applicable   Sharlene Dory, [place] 04/26/17 2218

## 2017-05-23 ENCOUNTER — Other Ambulatory Visit: Payer: Self-pay | Admitting: Family Medicine

## 2017-06-29 ENCOUNTER — Ambulatory Visit: Payer: BC Managed Care – PPO | Admitting: Family Medicine

## 2017-06-29 ENCOUNTER — Encounter: Payer: Self-pay | Admitting: Family Medicine

## 2017-06-29 VITALS — BP 128/70 | HR 76 | Temp 98.7°F | Resp 16 | Ht 66.5 in | Wt 216.8 lb

## 2017-06-29 DIAGNOSIS — N760 Acute vaginitis: Secondary | ICD-10-CM | POA: Insufficient documentation

## 2017-06-29 DIAGNOSIS — E785 Hyperlipidemia, unspecified: Secondary | ICD-10-CM | POA: Diagnosis not present

## 2017-06-29 DIAGNOSIS — E6609 Other obesity due to excess calories: Secondary | ICD-10-CM | POA: Diagnosis not present

## 2017-06-29 DIAGNOSIS — R002 Palpitations: Secondary | ICD-10-CM

## 2017-06-29 DIAGNOSIS — Z6834 Body mass index (BMI) 34.0-34.9, adult: Secondary | ICD-10-CM

## 2017-06-29 HISTORY — DX: Palpitations: R00.2

## 2017-06-29 LAB — POCT WET + KOH PREP
Trich by wet prep: ABSENT
Yeast by KOH: ABSENT
Yeast by wet prep: ABSENT

## 2017-06-29 MED ORDER — FLUCONAZOLE 150 MG PO TABS: 150.0000 mg | ORAL_TABLET | Freq: Once | ORAL | 0 refills | Status: AC

## 2017-06-29 MED ORDER — METRONIDAZOLE 500 MG PO TABS: 500.0000 mg | ORAL_TABLET | Freq: Two times a day (BID) | ORAL | 0 refills | Status: DC

## 2017-06-29 NOTE — Patient Instructions (Addendum)
For cholesterol and weight loss:  Please start red yeast rice over the counter Increase your intake of fiber  For vaginal discharge Take the metronidazole After completing take the diflucan Take probiotic throughout  For palpitations Drink plenty of water and will notify you of results  Palpitations A palpitation is the feeling that your heartbeat is irregular or is faster than normal. It may feel like your heart is fluttering or skipping a beat. Palpitations are usually not a serious problem. They may be caused by many things, including smoking, caffeine, alcohol, stress, and certain medicines. Although most causes of palpitations are not serious, palpitations can be a sign of a serious medical problem. In some cases, you may need further medical evaluation. Follow these instructions at home: Pay attention to any changes in your symptoms. Take these actions to help with your condition:  Avoid the following: ? Caffeinated coffee, tea, soft drinks, diet pills, and energy drinks. ? Chocolate. ? Alcohol.  Do not use any tobacco products, such as cigarettes, chewing tobacco, and e-cigarettes. If you need help quitting, ask your health care provider.  Try to reduce your stress and anxiety. Things that can help you relax include: ? Yoga. ? Meditation. ? Physical activity, such as swimming, jogging, or walking. ? Biofeedback. This is a method that helps you learn to use your mind to control things in your body, such as your heartbeats.  Get plenty of rest and sleep.  Take over-the-counter and prescription medicines only as told by your health care provider.  Keep all follow-up visits as told by your health care provider. This is important.  Contact a health care provider if:  You continue to have a fast or irregular heartbeat after 24 hours.  Your palpitations occur more often. Get help right away if:  You have chest pain or shortness of breath.  You have a severe  headache.  You feel dizzy or you faint. This information is not intended to replace advice given to you by your health care provider. Make sure you discuss any questions you have with your health care provider. Document Released: 08/08/2000 Document Revised: 01/14/2016 Document Reviewed: 04/26/2015 Elsevier Interactive Patient Education  2017 [employer]

## 2017-06-29 NOTE — Assessment & Plan Note (Signed)
ECG within normal limits. Will check thyroid and CBC Increased hydration advised If it persists then will refer for holter monitoring

## 2017-06-29 NOTE — Progress Notes (Addendum)
Chief Complaint  Patient presents with  . ? bacterial infection    ongoing between yeast and bacterial  . Chest Pain    x 2-3 weeks, fluttery feeling in chest and pt has to take a deep breath    HPI  She reports that she has been having a thin watery discharge Patient's last menstrual period was 06/22/2017. She reports that the discharge was present before the period and continued She reports that she had a steroid shot a few weeks Her last intercourse was in April 2018 she did not use a condom She denies douching She uses a fragrant spray in the inguinal crease but not in the vaginal area She denies vaginal itching She reports that she has previous history of vaginitis and her gynecologist in the past gave her boric acid  Pt reports that she went to Bariatric program and discussed her lipids She reports that she has intermittent palpitations that lasts a few seconds She stopped working out in the past year She reports that she was dancing vigorously at church and had to sit down due to coughing. She had to take her inhaler and it stopped. She reports that if she eats she does not notice the "rushing in her heart" and never during bowel movement. She reports that she can feel it while laying down at night.  She denies radiation She reports that she gets some night sweats but no sweats with the pain  obesity She reports that she does not eat much at work She was advised to eat 1200 calories and has not been able to do that She does not exercise She tried some meds but no appetite suppressants She has normal bm   Past Medical History:  Diagnosis Date  . IBS (irritable bowel syndrome)   . Polycystic ovarian syndrome     Current Outpatient Medications  Medication Sig Dispense Refill  . Albuterol Sulfate 108 (90 Base) MCG/ACT AEPB Inhale 1 puff into the lungs every 6 (six) hours as needed. 1 each 2  . fluticasone (FLONASE) 50 MCG/ACT nasal spray Place 2 sprays into both  nostrils daily. 16 g 2  . levocetirizine (XYZAL) 5 MG tablet Take 1 tablet (5 mg total) by mouth every evening. 30 tablet 0  . Diphenhyd-Hydrocort-Nystatin (FIRST-DUKES MOUTHWASH) SUSP 1 teaspoon as rinse gargle and sp 4 times a day (Patient not taking: Reported on 06/29/2017) 120 mL 1  . Drospirenone-Ethinyl Estradiol-Levomefol (SAFYRAL) 3-0.03-0.451 MG tablet Take 1 tablet by mouth daily.    . fluconazole (DIFLUCAN) 150 MG tablet Take 1 tablet (150 mg total) once for 1 dose by mouth. 1 tablet 0  . metroNIDAZOLE (FLAGYL) 500 MG tablet Take 1 tablet (500 mg total) 2 (two) times daily by mouth. 14 tablet 0   No current facility-administered medications for this visit.     Allergies:  Allergies  Allergen Reactions  . Penicillins Hives and Itching  . Sulfa Antibiotics Hives    Past Surgical History:  Procedure Laterality Date  . APPENDECTOMY      Social History   Socioeconomic History  . Marital status: Single    Spouse name: Not on file  . Number of children: Not on file  . Years of education: Not on file  . Highest education level: Not on file  Social Needs  . Financial resource strain: Not on file  . Food insecurity - worry: Not on file  . Food insecurity - inability: Not on file  . Transportation needs - medical: Not  on file  . Transportation needs - non-medical: Not on file  Occupational History  . Not on file  Tobacco Use  . Smoking status: Never Smoker  . Smokeless tobacco: Never Used  Substance and Sexual Activity  . Alcohol use: Not on file  . Drug use: Not on file  . Sexual activity: Not on file  Other Topics Concern  . Not on file  Social History Narrative  . Not on file    Family History  Problem Relation Age of Onset  . Hyperlipidemia Father   . Hypertension Father   . Diabetes Father      ROS Review of Systems See HPI Constitution: No fevers or chills No malaise No diaphoresis Skin: No rash or itching Eyes: no blurry vision, no double  vision GU: no dysuria or hematuria Neuro: no dizziness or headaches  Objective: Vitals:   06/29/17 1014  BP: 128/70  Pulse: 76  Resp: 16  Temp: 98.7 F (37.1 C)  TempSrc: Oral  SpO2: 98%  Weight: 216 lb 12.8 oz (98.3 kg)  Height: 5' 6.5" (1.689 m)   Body mass index is 34.47 kg/m.  Physical Exam  Constitutional: She is oriented to person, place, and time. She appears well-developed and well-nourished.  HENT:  Head: Normocephalic and atraumatic.  Cardiovascular: Normal rate and regular rhythm.  Abdominal: Soft. Bowel sounds are normal. She exhibits no distension and no mass. There is no tenderness. There is no guarding.  Musculoskeletal: Normal range of motion.       Right lower leg: She exhibits no edema.       Left lower leg: She exhibits no edema.  Neurological: She is alert and oriented to person, place, and time.  Psychiatric: She has a normal mood and affect. Her behavior is normal.    Vaginal exam- chaperone preset Labia normal bilaterally without skin lesions Urethral meatus normal appearing without erythema Vagina with white, thick discharge No CMT, ovaries small and not palpable Uterus midline, nontender  ECG -NSR  Assessment and Plan  Problem List Items Addressed This Visit      Genitourinary   Acute vaginitis - Primary    Will treat with metronidazole Will give diflucan as well Continue probiotics      Relevant Orders   POCT Wet + KOH Prep (Completed)   GC/Chlamydia Probe Amp     Other   Dyslipidemia    Diagnosed at diet clinic. Discussed red yeast rice and fiber as well as weight loss.       Relevant Orders   Lipid panel   Comprehensive metabolic panel   Class 1 obesity due to excess calories without serious comorbidity with body mass index (BMI) of 34.0 to 34.9 in adult    Discussed that calorie restricted diets do not help weight loss but can slow metabolism. She eats very few calories. Discussed increasing the quality of the foods and  having more healthy snacks. Exercising consistently.      Relevant Orders   Hemoglobin A1c   Lipid panel   Comprehensive metabolic panel   Palpitations    ECG within normal limits. Will check thyroid and CBC Increased hydration advised If it persists then will refer for holter monitoring      Relevant Orders   EKG 12-Lead (Completed)   TSH   CBC       Wilman Tucker A Shandora Koogler

## 2017-06-29 NOTE — Assessment & Plan Note (Signed)
Discussed that calorie restricted diets do not help weight loss but can slow metabolism. She eats very few calories. Discussed increasing the quality of the foods and having more healthy snacks. Exercising consistently.

## 2017-06-29 NOTE — Assessment & Plan Note (Signed)
Diagnosed at diet clinic. Discussed red yeast rice and fiber as well as weight loss.

## 2017-06-29 NOTE — Assessment & Plan Note (Signed)
Will treat with metronidazole Will give diflucan as well Continue probiotics

## 2017-06-30 LAB — COMPREHENSIVE METABOLIC PANEL
ALK PHOS: 72 IU/L (ref 39–117)
ALT: 13 IU/L (ref 0–32)
AST: 14 IU/L (ref 0–40)
Albumin/Globulin Ratio: 1.6 (ref 1.2–2.2)
Albumin: 4.6 g/dL (ref 3.5–5.5)
BUN / CREAT RATIO: 11 (ref 9–23)
BUN: 9 mg/dL (ref 6–20)
Bilirubin Total: 0.5 mg/dL (ref 0.0–1.2)
CO2: 22 mmol/L (ref 20–29)
CREATININE: 0.83 mg/dL (ref 0.57–1.00)
Calcium: 9.6 mg/dL (ref 8.7–10.2)
Chloride: 100 mmol/L (ref 96–106)
GFR calc Af Amer: 103 mL/min/{1.73_m2} (ref >59)
GFR calc non Af Amer: 90 mL/min/{1.73_m2} (ref >59)
GLOBULIN, TOTAL: 2.9 g/dL (ref 1.5–4.5)
Glucose: 95 mg/dL (ref 65–99)
POTASSIUM: 4.1 mmol/L (ref 3.5–5.2)
SODIUM: 138 mmol/L (ref 134–144)
Total Protein: 7.5 g/dL (ref 6.0–8.5)

## 2017-06-30 LAB — CBC
HEMOGLOBIN: 12.9 g/dL (ref 11.1–15.9)
Hematocrit: 39.1 % (ref 34.0–46.6)
MCH: 27.4 pg (ref 26.6–33.0)
MCHC: 33 g/dL (ref 31.5–35.7)
MCV: 83 fL (ref 79–97)
Platelets: 258 10*3/uL (ref 150–379)
RBC: 4.7 x10E6/uL (ref 3.77–5.28)
RDW: 15 % (ref 12.3–15.4)
WBC: 5.3 10*3/uL (ref 3.4–10.8)

## 2017-06-30 LAB — LIPID PANEL
CHOL/HDL RATIO: 4 ratio (ref 0.0–4.4)
CHOLESTEROL TOTAL: 200 mg/dL — AB (ref 100–199)
HDL: 50 mg/dL (ref >39)
LDL Calculated: 130 mg/dL — ABNORMAL HIGH (ref 0–99)
TRIGLYCERIDES: 102 mg/dL (ref 0–149)
VLDL Cholesterol Cal: 20 mg/dL (ref 5–40)

## 2017-06-30 LAB — HEMOGLOBIN A1C
ESTIMATED AVERAGE GLUCOSE: 128 mg/dL
HEMOGLOBIN A1C: 6.1 % — AB (ref 4.8–5.6)

## 2017-06-30 LAB — GC/CHLAMYDIA PROBE AMP
Chlamydia trachomatis, NAA: NEGATIVE
NEISSERIA GONORRHOEAE BY PCR: NEGATIVE

## 2017-06-30 LAB — TSH: TSH: 0.568 u[IU]/mL (ref 0.450–4.500)

## 2017-07-04 ENCOUNTER — Other Ambulatory Visit: Payer: Self-pay

## 2017-07-04 ENCOUNTER — Encounter (HOSPITAL_COMMUNITY): Payer: Self-pay | Admitting: Emergency Medicine

## 2017-07-04 ENCOUNTER — Emergency Department (HOSPITAL_COMMUNITY)
Admission: EM | Admit: 2017-07-04 | Discharge: 2017-07-04 | Disposition: A | Discharge status: Home / Self Care | Payer: No Typology Code available for payment source | Attending: Emergency Medicine | Admitting: Emergency Medicine

## 2017-07-04 DIAGNOSIS — Y92410 Unspecified street and highway as the place of occurrence of the external cause: Secondary | ICD-10-CM | POA: Diagnosis not present

## 2017-07-04 DIAGNOSIS — S0990XA Unspecified injury of head, initial encounter: Secondary | ICD-10-CM | POA: Insufficient documentation

## 2017-07-04 DIAGNOSIS — Z79899 Other long term (current) drug therapy: Secondary | ICD-10-CM | POA: Diagnosis not present

## 2017-07-04 DIAGNOSIS — M542 Cervicalgia: Secondary | ICD-10-CM | POA: Diagnosis not present

## 2017-07-04 DIAGNOSIS — Y9389 Activity, other specified: Secondary | ICD-10-CM | POA: Diagnosis not present

## 2017-07-04 DIAGNOSIS — M25512 Pain in left shoulder: Secondary | ICD-10-CM | POA: Insufficient documentation

## 2017-07-04 DIAGNOSIS — Y998 Other external cause status: Secondary | ICD-10-CM | POA: Diagnosis not present

## 2017-07-04 MED ORDER — CYCLOBENZAPRINE HCL 5 MG PO TABS: 5.0000 mg | ORAL_TABLET | Freq: Two times a day (BID) | ORAL | 0 refills | Status: DC | PRN

## 2017-07-04 MED ORDER — CYCLOBENZAPRINE HCL 10 MG PO TABS
5.0000 mg | ORAL_TABLET | Freq: Once | ORAL | Status: AC
  Administered 2017-07-04: 5 mg via ORAL
  Filled 2017-07-04: qty 1

## 2017-07-04 MED ORDER — IBUPROFEN 800 MG PO TABS
800.0000 mg | ORAL_TABLET | Freq: Once | ORAL | Status: AC
  Administered 2017-07-04: 800 mg via ORAL
  Filled 2017-07-04: qty 1

## 2017-07-04 MED ORDER — NAPROXEN 500 MG PO TABS: 500.0000 mg | ORAL_TABLET | Freq: Two times a day (BID) | ORAL | 0 refills | Status: DC
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
## 2017-07-06 ENCOUNTER — Ambulatory Visit: Payer: BC Managed Care – PPO | Admitting: Family Medicine

## 2017-07-06 ENCOUNTER — Ambulatory Visit: Payer: Self-pay | Admitting: *Deleted

## 2017-07-06 ENCOUNTER — Encounter: Payer: Self-pay | Admitting: Family Medicine

## 2017-07-06 ENCOUNTER — Other Ambulatory Visit: Payer: Self-pay

## 2017-07-06 VITALS — BP 110/82 | HR 88 | Temp 98.8°F | Resp 16 | Ht 67.0 in | Wt 216.2 lb

## 2017-07-06 DIAGNOSIS — M542 Cervicalgia: Secondary | ICD-10-CM | POA: Diagnosis not present

## 2017-07-06 DIAGNOSIS — M62838 Other muscle spasm: Secondary | ICD-10-CM
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
## 2017-07-06 NOTE — Patient Instructions (Addendum)
Continue antiinflammatory and flexeril for your pain as well as using an ice pack    IF you received an x-ray today, you will receive an invoice from [facility]. Please contact [facility] at [phone] with questions or concerns regarding your invoice.   IF you received labwork today, you will receive an invoice from [facility]. Please contact [facility] at [phone] with questions or concerns regarding your invoice.   Our billing staff will not be able to assist you with questions regarding bills from these companies.  You will be contacted with the lab results as soon as they are available. The fastest way to get your results is to activate your My Chart account. Instructions are located on the last page of this paperwork. If you have not heard from us regarding the results in 2 weeks, please contact this office.     Cervical Strain and Sprain Rehab Ask your health care provider which exercises are safe for you. Do exercises exactly as told by your health care provider and adjust them as directed. It is normal to feel mild stretching, pulling, tightness, or discomfort as you do these exercises, but you should stop right away if you feel sudden pain or your pain gets worse.Do not begin these exercises until told by your health care provider. Stretching and range of motion exercises These exercises warm up your muscles and joints and improve the movement and flexibility of your neck. These exercises also help to relieve pain, numbness, and tingling. Exercise A: Cervical side bend  1. Using good posture, sit on a stable chair or stand up. 2. Without moving your shoulders, slowly tilt your left / right ear to your shoulder until you feel a stretch in your neck muscles. You should be looking straight ahead. 3. Hold for __________ seconds. 4. Repeat with the other side of your neck. Repeat __________ times. Complete this exercise __________ times a day. Exercise B: Cervical  rotation  1. Using good posture, sit on a stable chair or stand up. 2. Slowly turn your head to the side as if you are looking over your left / right shoulder. ? Keep your eyes level with the ground. ? Stop when you feel a stretch along the side and the back of your neck. 3. Hold for __________ seconds. 4. Repeat this by turning to your other side. Repeat __________ times. Complete this exercise __________ times a day. Exercise C: Thoracic extension and pectoral stretch 1. Roll a towel or a small blanket so it is about 4 inches (10 cm) in diameter. 2. Lie down on your back on a firm surface. 3. Put the towel lengthwise, under your spine in the middle of your back. It should not be not under your shoulder blades. The towel should line up with your spine from your middle back to your lower back. 4. Put your hands behind your head and let your elbows fall out to your sides. 5. Hold for __________ seconds. Repeat __________ times. Complete this exercise __________ times a day. Strengthening exercises These exercises build strength and endurance in your neck. Endurance is the ability to use your muscles for a long time, even after your muscles get tired. Exercise D: Upper cervical flexion, isometric 1. Lie on your back with a thin pillow behind your head and a small rolled-up towel under your neck. 2. Gently tuck your chin toward your chest and nod your head down to look toward your feet. Do not lift your head off the pillow. 3.  Hold for __________ seconds. 4. Release the tension slowly. Relax your neck muscles completely before you repeat this exercise. Repeat __________ times. Complete this exercise __________ times a day. Exercise E: Cervical extension, isometric  1. Stand about 6 inches (15 cm) away from a wall, with your back facing the wall. 2. Place a soft object, about 6-8 inches (15-20 cm) in diameter, between the back of your head and the wall. A soft object could be a small pillow, a  ball, or a folded towel. 3. Gently tilt your head back and press into the soft object. Keep your jaw and forehead relaxed. 4. Hold for __________ seconds. 5. Release the tension slowly. Relax your neck muscles completely before you repeat this exercise. Repeat __________ times. Complete this exercise __________ times a day. Posture and body mechanics  Body mechanics refers to the movements and positions of your body while you do your daily activities. Posture is part of body mechanics. Good posture and healthy body mechanics can help to relieve stress in your body's tissues and joints. Good posture means that your spine is in its natural S-curve position (your spine is neutral), your shoulders are pulled back slightly, and your head is not tipped forward. The following are general guidelines for applying improved posture and body mechanics to your everyday activities. Standing  When standing, keep your spine neutral and keep your feet about hip-width apart. Keep a slight bend in your knees. Your ears, shoulders, and hips should line up.  When you do a task in which you stand in one place for a long time, place one foot up on a stable object that is 2-4 inches (5-10 cm) high, such as a footstool. This helps keep your spine neutral. Sitting   When sitting, keep your spine neutral and your keep feet flat on the floor. Use a footrest, if necessary, and keep your thighs parallel to the floor. Avoid rounding your shoulders, and avoid tilting your head forward.  When working at a desk or a computer, keep your desk at a height where your hands are slightly lower than your elbows. Slide your chair under your desk so you are close enough to maintain good posture.  When working at a computer, place your monitor at a height where you are looking straight ahead and you do not have to tilt your head forward or downward to look at the screen. Resting When lying down and resting, avoid positions that are most  painful for you. Try to support your neck in a neutral position. You can use a contour pillow or a small rolled-up towel. Your pillow should support your neck but not push on it. This information is not intended to replace advice given to you by your health care provider. Make sure you discuss any questions you have with your health care provider. Document Released: 08/11/2005 Document Revised: 04/17/2016 Document Reviewed: 07/18/2015 Elsevier Interactive Patient Education  [employer]2018 [employer]

## 2017-07-15 ENCOUNTER — Telehealth: Payer: Self-pay | Admitting: Family Medicine

## 2017-07-15 NOTE — Telephone Encounter (Signed)
Pt is requesting refill on the Dukes mouthwash and something like Diflucan for yeast that she has developed from her antibiotic. She would the like these meds sent to a [facility] at [address], [place], [place] [id number].

## 2017-07-17 NOTE — Telephone Encounter (Signed)
Preferred pharmacy updated.  Provider, please prescribe if appropriate.

## 2017-07-17 NOTE — Telephone Encounter (Signed)
I have not seen this pt, don't know about these meds - has been seeing Stallings.

## 2017-09-05 ENCOUNTER — Ambulatory Visit: Payer: BC Managed Care – PPO | Admitting: Family Medicine

## 2017-09-22 ENCOUNTER — Telehealth: Payer: Self-pay | Admitting: *Deleted

## 2017-09-22 NOTE — Telephone Encounter (Signed)
Lft vmail for pt to call back and schedule Annual/pap.. 

## 2017-10-12 ENCOUNTER — Other Ambulatory Visit: Payer: Self-pay

## 2017-10-12 ENCOUNTER — Ambulatory Visit (HOSPITAL_COMMUNITY)
Admission: EM | Admit: 2017-10-12 | Discharge: 2017-10-12 | Disposition: A | Discharge status: Home / Self Care | Payer: BC Managed Care – PPO | Attending: Family Medicine | Admitting: Family Medicine

## 2017-10-12 DIAGNOSIS — R6889 Other general symptoms and signs: Secondary | ICD-10-CM | POA: Diagnosis not present

## 2017-10-12 MED ORDER — OSELTAMIVIR PHOSPHATE 75 MG PO CAPS: 75.0000 mg | ORAL_CAPSULE | Freq: Two times a day (BID) | ORAL | 0 refills | Status: DC

## 2017-10-12 MED ORDER — ACETAMINOPHEN 325 MG PO TABS
ORAL_TABLET | ORAL | Status: AC
  Filled 2017-10-12: qty 2

## 2017-10-12 MED ORDER — ACETAMINOPHEN 325 MG PO TABS
650.0000 mg | ORAL_TABLET | Freq: Once | ORAL | Status: AC
  Administered 2017-10-12: 650 mg via ORAL

## 2017-10-12 NOTE — ED Provider Notes (Signed)
[facility]    CSN: [id number] Arrival date & time: 10/12/17  1945     History   Chief Complaint Chief Complaint  Patient presents with  . Cough    HPI Anna Cole is a 37 y.o. female.   HPI  Duration: 1 week; today started having cough, fevers, chills, and muscle aches; still having sinus congestion and runny nose Denies: sinus pain, itchy watery eyes, ear pain, ear drainage, sore throat, wheezing and shortness of breath Treatment to date: None Sick contacts: No    Past Medical History:  Diagnosis Date  . IBS (irritable bowel syndrome)   . Polycystic ovarian syndrome     Patient Active Problem List   Diagnosis Date Noted  . Dyslipidemia 06/29/2017  . Class 1 obesity due to excess calories without serious comorbidity with body mass index (BMI) of 34.0 to 34.9 in adult 06/29/2017  . Palpitations 06/29/2017  . Acute vaginitis 06/29/2017    Past Surgical History:  Procedure Laterality Date  . APPENDECTOMY    . polyps removed from colon      Home Medications    Prior to Admission medications   Medication Sig Start Date End Date Taking? Authorizing Provider  Albuterol Sulfate 108 (90 Base) MCG/ACT AEPB Inhale 1 puff into the lungs every 6 (six) hours as needed. 04/26/17  Yes Sharlene DoryWendling, Haly Feher Paul, DO  oseltamivir (TAMIFLU) 75 MG capsule Take 1 capsule (75 mg total) by mouth every 12 (twelve) hours. 10/12/17   Sharlene DoryWendling, Deavon Podgorski Paul, DO    Family History Family History  Problem Relation Age of Onset  . Hyperlipidemia Father   . Hypertension Father   . Diabetes Father     Social History Social History   Tobacco Use  . Smoking status: Never Smoker  . Smokeless tobacco: Never Used  Substance Use Topics  . Alcohol use: Yes    Comment: rare  . Drug use: No     Allergies   Penicillins and Sulfa antibiotics   Review of Systems Review of Systems  Constitutional: Positive for chills and fever.  Respiratory: Positive for cough.       Physical Exam Triage Vital Signs ED Triage Vitals  Enc Vitals Group     BP 10/12/17 2011 (!) 138/93     Pulse Rate 10/12/17 2011 100     Resp 10/12/17 2011 18     Temp 10/12/17 2011 (!) 101.1 F (38.4 C)     Temp Source 10/12/17 2011 Oral     SpO2 10/12/17 2011 100 %   Updated Vital Signs BP (!) 138/93 (BP Location: Left Arm)   Pulse 100   Temp (!) 101.1 F (38.4 C) (Oral)   Resp 18   LMP 09/17/2017 (Exact Date)   SpO2 100%   Physical Exam  Constitutional: She appears well-developed.  HENT:  Head: Normocephalic.  Right Ear: External ear normal.  Left Ear: External ear normal.  Nose: Nose normal.  Mouth/Throat: Oropharynx is clear and moist. No oropharyngeal exudate.  Eyes: Pupils are equal, round, and reactive to light.  Neck: Normal range of motion. Neck supple.  Cardiovascular: Normal rate and regular rhythm.  No murmur heard. Pulmonary/Chest: Effort normal and breath sounds normal. No respiratory distress.  Psychiatric: She has a normal mood and affect. Judgment normal.     UC Treatments / Results  Procedures Procedures none  Medications Ordered in UC Medications  acetaminophen (TYLENOL) tablet 650 mg (650 mg Oral Given 10/12/17 2018)     Initial Impression /  Assessment and Plan / UC Course  I have reviewed the triage vital signs and the nursing notes.  Pertinent labs & imaging results that were available during my care of the patient were reviewed by me and considered in my medical decision making (see chart for details).     37 yo BF presents w s/s's of flu. Since her flu symptoms started today, will tx with antiviral. Cont supportive care w fluids, Tylenol, NSAIDs as needed. Letter for work given excusing her to return on Thurs 2/21 or sooner if she is feeling better. She also requested I disclose her dx and the fact she had a fever today which I did in her letter. Warning s/s's verbalized and written down F/u w pcp prn.   Final Clinical  Impressions(s) / UC Diagnoses   Final diagnoses:  Flu-like symptoms    ED Discharge Orders        Ordered    oseltamivir (TAMIFLU) 75 MG capsule  Every 12 hours     10/12/17 2026       Controlled Substance Prescriptions [place] Controlled Substance Registry consulted? Not Applicable   Sharlene Dory, [place] 10/12/17 2035

## 2017-10-12 NOTE — ED Triage Notes (Signed)
[facility] a [facility], cold chills and body aches x 1 week.

## 2017-10-12 NOTE — Discharge Instructions (Signed)
Continue to push fluids, practice good hand hygiene, and cover your mouth if you cough.  If you start having increasing fevers, shaking or shortness of breath, seek immediate care.  Ibuprofen 400-600 mg (2-3 over the counter strength tabs) every 6 hours as needed for pain.  OK to take Tylenol 1000 mg (2 extra strength tabs) or 975 mg (3 regular strength tabs) every 6 hours as needed.  For symptoms, consider using Vick's VapoRub on chest or under nose, air humidifier, Benadryl at night, and elevating the head of the bed.

## 2018-01-28 ENCOUNTER — Encounter: Payer: Self-pay | Admitting: Family Medicine

## 2018-01-28 ENCOUNTER — Other Ambulatory Visit: Payer: Self-pay

## 2018-01-28 ENCOUNTER — Ambulatory Visit: Payer: BC Managed Care – PPO | Admitting: Family Medicine

## 2018-01-28 ENCOUNTER — Ambulatory Visit (INDEPENDENT_AMBULATORY_CARE_PROVIDER_SITE_OTHER): Payer: BC Managed Care – PPO

## 2018-01-28 VITALS — BP 130/76 | HR 100 | Temp 99.7°F | Ht 67.0 in | Wt 214.0 lb

## 2018-01-28 DIAGNOSIS — R05 Cough: Secondary | ICD-10-CM

## 2018-01-28 DIAGNOSIS — R059 Cough, unspecified: Secondary | ICD-10-CM

## 2018-01-28 DIAGNOSIS — R062 Wheezing: Secondary | ICD-10-CM

## 2018-01-28 MED ORDER — METHYLPREDNISOLONE SODIUM SUCC 125 MG IJ SOLR
125.0000 mg | Freq: Once | INTRAMUSCULAR | Status: AC
  Administered 2018-01-28: 125 mg via INTRAMUSCULAR

## 2018-01-28 MED ORDER — ALBUTEROL SULFATE (2.5 MG/3ML) 0.083% IN NEBU
2.5000 mg | INHALATION_SOLUTION | Freq: Once | RESPIRATORY_TRACT | Status: AC
  Administered 2018-01-28: 2.5 mg via RESPIRATORY_TRACT

## 2018-01-28 MED ORDER — AZITHROMYCIN 250 MG PO TABS: ORAL_TABLET | ORAL | 0 refills | Status: DC

## 2018-01-28 NOTE — Progress Notes (Signed)
Chief Complaint  Patient presents with  . Shortness of Breath    HPI  Pt reports that she has been cough with wheezing  She has a lot of mucus as well Her sister has pneumonia Reports some shortness of breath No fevers or chills No history of asthma Reports some fatigue  Past Medical History:  Diagnosis Date  . IBS (irritable bowel syndrome)   . Polycystic ovarian syndrome     Current Outpatient Medications  Medication Sig Dispense Refill  . Albuterol Sulfate 108 (90 Base) MCG/ACT AEPB Inhale 1 puff into the lungs every 6 (six) hours as needed. 1 each 2  . azithromycin (ZITHROMAX) 250 MG tablet Take 2 tablets on  Day 1, take 1 each day after 6 tablet 0  . oseltamivir (TAMIFLU) 75 MG capsule Take 1 capsule (75 mg total) by mouth every 12 (twelve) hours. 10 capsule 0   Current Facility-Administered Medications  Medication Dose Route Frequency Provider Last Rate Last Dose  . albuterol (PROVENTIL) (2.5 MG/3ML) 0.083% nebulizer solution 2.5 mg  2.5 mg Nebulization Once Krystl Wickware A, MD      . methylPREDNISolone sodium succinate (SOLU-MEDROL) 125 mg/2 mL injection 125 mg  125 mg Intramuscular Once Doristine BosworthStallings, Airabella Barley A, MD        Allergies:  Allergies  Allergen Reactions  . Penicillins Hives and Itching  . Sulfa Antibiotics Hives    Past Surgical History:  Procedure Laterality Date  . APPENDECTOMY    . polyps removed from colon      Social History   Socioeconomic History  . Marital status: Single    Spouse name: Not on file  . Number of children: Not on file  . Years of education: Not on file  . Highest education level: Not on file  Occupational History  . Not on file  Social Needs  . Financial resource strain: Not on file  . Food insecurity:    Worry: Not on file    Inability: Not on file  . Transportation needs:    Medical: Not on file    Non-medical: Not on file  Tobacco Use  . Smoking status: Never Smoker  . Smokeless tobacco: Never Used  Substance  and Sexual Activity  . Alcohol use: Yes    Comment: rare  . Drug use: No  . Sexual activity: Not on file  Lifestyle  . Physical activity:    Days per week: Not on file    Minutes per session: Not on file  . Stress: Not on file  Relationships  . Social connections:    Talks on phone: Not on file    Gets together: Not on file    Attends religious service: Not on file    Active member of club or organization: Not on file    Attends meetings of clubs or organizations: Not on file    Relationship status: Not on file  Other Topics Concern  . Not on file  Social History Narrative  . Not on file    Family History  Problem Relation Age of Onset  . Hyperlipidemia Father   . Hypertension Father   . Diabetes Father      ROS Review of Systems See HPI Constitution: No fevers or chills No malaise No diaphoresis Skin: No rash or itching Eyes: no blurry vision, no double vision GU: no dysuria or hematuria Neuro: no dizziness or headaches * all others reviewed and negative   Objective: Vitals:   01/28/18 1632  Pulse: 100  Temp: 99.7 F (37.6 C)  TempSrc: Oral  SpO2: 100%  Weight: 214 lb (97.1 kg)  Height: 5\' 7"  (1.702 m)    Physical Exam  General: alert, oriented, in NAD Head: normocephalic, atraumatic, no sinus tenderness Eyes: EOM intact, no scleral icterus or conjunctival injection Ears: TM clear bilaterally Nose: mucosa nonerythematous, nonedematous Throat: no pharyngeal exudate or erythema Lymph: no posterior auricular, submental or cervical lymph adenopathy Heart: normal rate, normal sinus rhythm, no murmurs Lungs: clear to auscultation bilaterally, no wheezing   EXAM: CHEST - 2 VIEW  COMPARISON:  None available.  FINDINGS: Cardiac and mediastinal silhouettes within normal limits.  Lungs normally inflated. Subtle mild peribronchial thickening present within the mid and lower lungs, which could reflect sequelae of acute bronchiolitis given provided  history. No consolidative opacity to suggest pneumonia. No edema or effusion. No pneumothorax.  Osseous structures within normal limits.  IMPRESSION: Subtle diffuse peribronchial thickening within the mid and lower lungs, suggestive of possible bronchiolitis given provided history of cough and wheezing. No consolidative opacity to suggest pneumonia.   Electronically Signed   By: Rise Mu M.D.   On: 01/28/2018 16:46 COMPARISON:  None available.  FINDINGS: Cardiac and mediastinal silhouettes within normal limits.  Lungs normally inflated. Subtle mild peribronchial thickening present within the mid and lower lungs, which could reflect sequelae of acute bronchiolitis given provided history. No consolidative opacity to suggest pneumonia. No edema or effusion. No pneumothorax.  Osseous structures within normal limits.  IMPRESSION: Subtle diffuse peribronchial thickening within the mid and lower lungs, suggestive of possible bronchiolitis given provided history of cough and wheezing. No consolidative opacity to suggest pneumonia.   Electronically Signed   By: Rise Mu M.D.   On: 01/28/2018 16:46   Assessment and Plan Renda was seen today for shortness of breath.  Diagnoses and all orders for this visit:  Cough -     DG Chest 2 View; Future -     albuterol (PROVENTIL) (2.5 MG/3ML) 0.083% nebulizer solution 2.5 mg -     methylPREDNISolone sodium succinate (SOLU-MEDROL) 125 mg/2 mL injection 125 mg  Wheezing -     DG Chest 2 View; Future -     methylPREDNISolone sodium succinate (SOLU-MEDROL) 125 mg/2 mL injection 125 mg  Other orders -     azithromycin (ZITHROMAX) 250 MG tablet; Take 2 tablets on  Day 1, take 1 each day after  Consistent with pneumonia Discussed treatment with steroid to help with respiratory issues Symptoms improved with in office nebulizer cxr consistent with CAP   Jermar Colter A Creta Levin

## 2018-01-28 NOTE — Patient Instructions (Signed)
     IF you received an x-ray today, you will receive an invoice from [facility]. Please contact [facility] at [phone] with questions or concerns regarding your invoice.   IF you received labwork today, you will receive an invoice from [facility]. Please contact [facility] at [phone] with questions or concerns regarding your invoice.   Our billing staff will not be able to assist you with questions regarding bills from these companies.  You will be contacted with the lab results as soon as they are available. The fastest way to get your results is to activate your My Chart account. Instructions are located on the last page of this paperwork. If you have not heard from us regarding the results in 2 weeks, please contact this office.     

## 2018-02-02 ENCOUNTER — Telehealth: Payer: Self-pay

## 2018-02-02 ENCOUNTER — Other Ambulatory Visit: Payer: Self-pay

## 2018-02-02 MED ORDER — ALBUTEROL SULFATE 108 (90 BASE) MCG/ACT IN AEPB: 1.0000 | INHALATION_SPRAY | Freq: Four times a day (QID) | RESPIRATORY_TRACT | 2 refills | Status: DC | PRN

## 2018-02-02 NOTE — Telephone Encounter (Signed)
Copied from CRM [phone]#[id number]. Topic: General - Other >> Feb 01, 2018  3:16 PM Mcneil, Ja-Kwan wrote: Reason for CRM: Pt asking if a Rx for a steroid inhaler could be sent in to [id number] - [place], [place] - [address] Cb# [phone]

## 2018-02-02 NOTE — Telephone Encounter (Signed)
Refill sent to pharmacy.   

## 2018-02-17 ENCOUNTER — Encounter: Payer: Self-pay | Admitting: Family Medicine

## 2018-03-18 ENCOUNTER — Encounter: Payer: Self-pay | Admitting: Family Medicine

## 2018-03-18 ENCOUNTER — Other Ambulatory Visit: Payer: Self-pay

## 2018-03-18 ENCOUNTER — Ambulatory Visit: Payer: BC Managed Care – PPO | Admitting: Family Medicine

## 2018-03-18 ENCOUNTER — Ambulatory Visit (INDEPENDENT_AMBULATORY_CARE_PROVIDER_SITE_OTHER): Payer: BC Managed Care – PPO

## 2018-03-18 VITALS — BP 124/77 | HR 73 | Temp 98.6°F | Resp 18 | Ht 67.0 in | Wt 218.8 lb

## 2018-03-18 DIAGNOSIS — M62830 Muscle spasm of back: Secondary | ICD-10-CM | POA: Diagnosis not present

## 2018-03-18 DIAGNOSIS — M542 Cervicalgia: Secondary | ICD-10-CM | POA: Diagnosis not present

## 2018-03-18 MED ORDER — MELOXICAM 15 MG PO TABS
15.0000 mg | ORAL_TABLET | ORAL | 1 refills | Status: DC
Start: 2018-03-18 — End: 2018-10-20

## 2018-03-18 MED ORDER — TIZANIDINE HCL 4 MG PO TABS: 2.0000 mg | ORAL_TABLET | Freq: Every day | ORAL | 1 refills | Status: DC

## 2018-03-18 NOTE — Patient Instructions (Addendum)
I want you to take the prescribed medication on a SCHEDULED basis - meloxicam every morning and tizanidine (as much as you can tolerate 1 hr prior to bed on an empty stomach) for a minimum of 1 mo but up to 2 months or until you are completely pain free. If it is not helped at all in a month, please call for a referral to Sports Medicine (or Orthopedics). After you take the tizanidine at night, apply 15 min of heat, then apply 15 min of pressure (tennis ball or foam roller to areas of pain - gently roll your body around on top of them while you are laying flat on the floor and the roller or ball is under your.  Then do your 15 minutes of stretching. Then go to bed but try to maintain good spinal alignment - consider sleeping with an airline horseshoe pillow around your neck if needed.  Do not use with any otc pain medication other than tylenol/acetaminophen for the next 1-2 months while you are on the prescription regimen - so no aleve, ibuprofen, motrin, advil, etc.  I wonder if you would respond to some more alternative bodywork hands out modalities much better (like myofascial release or craniosacral therapy) rather than just the standard chiropractor techniques. Check out: (Let me know if you want a PT referral to [facility])  [facility] [email or web address] [address] [place] [address], [place], [place] [address] Phone: [phone]  [facility]  [email or web address] [address], [address] [place], [place] [address] Phone: [phone]  [facility] and[facility]or [facility] [place]  [email or web address] [address], [address]   [place], [place] [address] Phone: [phone]     IF you received an x-ray today, you will receive an invoice from [facility]. Please contact [facility] at [phone] with questions or concerns regarding  your invoice.   IF you received labwork today, you will receive an invoice from [facility]. Please contact [facility] at [phone] with questions or concerns regarding your invoice.   Our billing staff will not be able to assist you with questions regarding bills from these companies.  You will be contacted with the lab results as soon as they are available. The fastest way to get your results is to activate your My Chart account. Instructions are located on the last page of this paperwork. If you have not heard from us regarding the results in 2 weeks, please contact this office.
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
## 2018-04-21 ENCOUNTER — Ambulatory Visit: Payer: BC Managed Care – PPO | Admitting: Family Medicine

## 2018-05-04 ENCOUNTER — Telehealth: Payer: Self-pay | Admitting: Family Medicine

## 2018-05-04 NOTE — Telephone Encounter (Signed)
Please advise 
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
## 2018-05-23 NOTE — Telephone Encounter (Signed)
It is in her AVS from her last visit - just copy and paste it into MyChart

## 2018-05-25 NOTE — Telephone Encounter (Signed)
Mychart message sent with information for pt.

## 2018-06-14 ENCOUNTER — Ambulatory Visit: Payer: BC Managed Care – PPO | Admitting: Family Medicine

## 2018-10-19 ENCOUNTER — Ambulatory Visit: Payer: BC Managed Care – PPO | Admitting: Family Medicine

## 2018-10-19 ENCOUNTER — Encounter: Payer: Self-pay | Admitting: Family Medicine

## 2018-10-19 VITALS — BP 120/83 | HR 72 | Temp 98.9°F | Resp 17 | Ht 67.0 in | Wt 218.0 lb

## 2018-10-19 DIAGNOSIS — J329 Chronic sinusitis, unspecified: Secondary | ICD-10-CM

## 2018-10-19 DIAGNOSIS — J069 Acute upper respiratory infection, unspecified: Secondary | ICD-10-CM

## 2018-10-19 DIAGNOSIS — R062 Wheezing: Secondary | ICD-10-CM | POA: Diagnosis not present

## 2018-10-19 DIAGNOSIS — J4521 Mild intermittent asthma with (acute) exacerbation: Secondary | ICD-10-CM | POA: Diagnosis not present

## 2018-10-19 DIAGNOSIS — J310 Chronic rhinitis: Secondary | ICD-10-CM

## 2018-10-19 MED ORDER — PREDNISONE 20 MG PO TABS: ORAL_TABLET | ORAL | 0 refills | Status: DC

## 2018-10-19 MED ORDER — CEFDINIR 300 MG PO CAPS: 600.0000 mg | ORAL_CAPSULE | Freq: Every day | ORAL | 0 refills | Status: DC

## 2018-10-19 MED ORDER — FLUCONAZOLE 150 MG PO TABS: 150.0000 mg | ORAL_TABLET | Freq: Once | ORAL | 0 refills | Status: AC

## 2018-10-19 MED ORDER — ALBUTEROL SULFATE 108 (90 BASE) MCG/ACT IN AEPB: 1.0000 | INHALATION_SPRAY | Freq: Four times a day (QID) | RESPIRATORY_TRACT | 2 refills | Status: DC | PRN

## 2018-10-19 MED ORDER — METHYLPREDNISOLONE SODIUM SUCC 125 MG IJ SOLR
80.0000 mg | Freq: Once | INTRAMUSCULAR | Status: AC
  Administered 2018-10-19: 80 mg via INTRAVENOUS

## 2018-10-19 NOTE — Progress Notes (Signed)
Patient ID: Anna Cole, female    DOB: 1980-09-08  Age: 38 y.o. MRN: [id number]  Chief Complaint  Patient presents with  . Nasal Congestion  . URI  . Cough    Subjective:   Patient has been sick for for 5 days with a respiratory tract infection.  She has a lot of purulent drainage from her nose and is coughing up a lot of purulent material.  She has been wheezing a lot.  She took 1 prednisone on Saturday that she had leftover from before.  She has been coughing constantly and had to stay off work yesterday and today.  She is a Runner, broadcasting/film/video.  Current allergies, medications, problem list, past/family and social histories reviewed.  Objective:  BP 120/83   Pulse 72   Temp 98.9 F (37.2 C) (Oral)   Resp 17   Ht 5\' 7"  (1.702 m)   Wt 218 lb (98.9 kg)   LMP 10/17/2018 (Approximate)   SpO2 98%   BMI 34.14 kg/m   Coughing and wheezing.  TMs normal.  Throat not erythematous.  Nose congested.  Neck supple without nodes.  Chest actually was fairly clear until she did forced expiration and she has tight prolonged wheezing.  Heart regular without murmur.  Assessment & Plan:   Assessment: 1. Wheezing   2. 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 Acute upper respiratory infection   4. Rhinosinusitis       Plan: See instructions  No orders of the defined types were placed in this encounter.   Meds ordered this encounter  Medications  . methylPREDNISolone sodium succinate (SOLU-MEDROL) 125 mg/2 mL injection 80 mg  . cefdinir (OMNICEF) 300 MG capsule    Sig: Take 2 capsules (600 mg total) by mouth daily.    Dispense:  20 capsule    Refill:  0  . predniSONE (DELTASONE) 20 MG tablet    Sig: Beginning Wednesday take 3 each a.m. for 2 days, then 2 daily for 2 days, then 1 daily for 2 days.    Dispense:  12 tablet    Refill:  0  . fluconazole (DIFLUCAN) 150 MG tablet    Sig: Take 1 tablet (150 mg total) by mouth once for 1 dose. Repeat if needed   Dispense:  2 tablet    Refill:  0  . Albuterol Sulfate 108 (90 Base) MCG/ACT AEPB    Sig: Inhale 1 puff into the lungs every 6 (six) hours as needed.    Dispense:  1 each    Refill:  2         Patient Instructions     Drink plenty of fluids to stay well-hydrated  Stay out of school through Thursday, and if improving go back to work on Friday.  Use the albuterol inhaler 2 inhalations every 4-6 hours as needed  Tomorrow begin the prednisone taking 3 daily for 2 days, then 2 daily for 2 days, then 1 daily for 2 days  The Omnicef antibiotic is 2 pills daily.  About 5% of people that have had penicillin allergy can have an allergy to this, but it is really pretty rare.  If you have any problems from it discontinue immediately.  Return if worse  Please take full course of antibiotics and prednisone.  Do not keep any leftover antibiotics for partial treatment of future times.    If you have lab work done today you will be contacted with your lab results within the next 2 weeks.  If you have not heard from [place] then please contact us. The fastest way to get your results is to register for My Chart.   IF you received an x-ray today, you will receive an invoice from [facility]. Please contact [facility] at [phone] with questions or concerns regarding your invoice.   IF you received labwork today, you will receive an invoice from [place]. Please contact [facility] at [phone] with questions or concerns regarding your invoice.   Our billing staff will not be able to assist you with questions regarding bills from these companies.  You will be contacted with the lab results as soon as they are available. The fastest way to get your results is to activate your My Chart account. Instructions are located on the last page of this paperwork. If you have not heard from [place] regarding the results in 2 weeks, please contact this office.         Return if symptoms  worsen or fail to improve.   Janace Hoard, MD 10/19/2018

## 2018-10-19 NOTE — Patient Instructions (Addendum)
   Drink plenty of fluids to stay well-hydrated  Stay out of school through Thursday, and if improving go back to work on Friday.  Use the albuterol inhaler 2 inhalations every 4-6 hours as needed  Tomorrow begin the prednisone taking 3 daily for 2 days, then 2 daily for 2 days, then 1 daily for 2 days  The Omnicef antibiotic is 2 pills daily.  About 5% of people that have had penicillin allergy can have an allergy to this, but it is really pretty rare.  If you have any problems from it discontinue immediately.  Return if worse  Please take full course of antibiotics and prednisone.  Do not keep any leftover antibiotics for partial treatment of future times.    If you have lab work done today you will be contacted with your lab results within the next 2 weeks.  If you have not heard from [place] then please contact us. The fastest way to get your results is to register for My Chart.   IF you received an x-ray today, you will receive an invoice from [facility]. Please contact [facility] at [phone] with questions or concerns regarding your invoice.   IF you received labwork today, you will receive an invoice from [place]. Please contact [facility] at [phone] with questions or concerns regarding your invoice.   Our billing staff will not be able to assist you with questions regarding bills from these companies.  You will be contacted with the lab results as soon as they are available. The fastest way to get your results is to activate your My Chart account. Instructions are located on the last page of this paperwork. If you have not heard from [place] regarding the results in 2 weeks, please contact this office.

## 2018-10-20 ENCOUNTER — Other Ambulatory Visit: Payer: Self-pay

## 2018-10-20 ENCOUNTER — Encounter: Payer: Self-pay | Admitting: Family Medicine

## 2018-10-20 ENCOUNTER — Ambulatory Visit: Payer: BC Managed Care – PPO | Admitting: Family Medicine

## 2018-10-20 VITALS — BP 123/83 | HR 88 | Temp 98.8°F | Resp 16 | Ht 67.0 in | Wt 218.0 lb

## 2018-10-20 DIAGNOSIS — M62830 Muscle spasm of back: Secondary | ICD-10-CM | POA: Diagnosis not present

## 2018-10-20 DIAGNOSIS — M62838 Other muscle spasm: Secondary | ICD-10-CM | POA: Diagnosis not present

## 2018-10-20 DIAGNOSIS — M542 Cervicalgia: Secondary | ICD-10-CM | POA: Diagnosis not present

## 2018-10-20 DIAGNOSIS — Z6834 Body mass index (BMI) 34.0-34.9, adult: Secondary | ICD-10-CM

## 2018-10-20 DIAGNOSIS — E6609 Other obesity due to excess calories: Secondary | ICD-10-CM

## 2018-10-20 MED ORDER — MELOXICAM 15 MG PO TABS: 15.0000 mg | ORAL_TABLET | ORAL | 1 refills | Status: DC

## 2018-10-20 MED ORDER — METHOCARBAMOL 500 MG PO TABS: 500.0000 mg | ORAL_TABLET | Freq: Every day | ORAL | 1 refills | Status: DC
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
## 2018-10-20 NOTE — Patient Instructions (Addendum)
Ok to hold off on the oral prednisone for now but do start the CEFDINIR (antibiotic you were prescribed yesterday).   Start the meloxicam every morning. Try the methocarbamol at night whenever the tightness recurs. You can expect to need monthly standing PT appointments. Contin heat, stretching, exercises at night.    If you have lab work done today you will be contacted with your lab results within the next 2 weeks.  If you have not heard from [place] then please contact us. The fastest way to get your results is to register for My Chart.   IF you received an x-ray today, you will receive an invoice from [facility]. Please contact [facility] at [phone] with questions or concerns regarding your invoice.   IF you received labwork today, you will receive an invoice from [place]. Please contact [facility] at [phone] with questions or concerns regarding your invoice.   Our billing staff will not be able to assist you with questions regarding bills from these companies.  You will be contacted with the lab results as soon as they are available. The fastest way to get your results is to activate your My Chart account. Instructions are located on the last page of this paperwork. If you have not heard from [place] regarding the results in 2 weeks, please contact this office.     Muscle Cramps and Spasms Muscle cramps and spasms occur when a muscle or muscles tighten and you have no control over this tightening (involuntary muscle contraction). They are a common problem and can develop in any muscle. The most common place is in the calf muscles of the leg. Muscle cramps and muscle spasms are both involuntary muscle contractions, but there are some differences between the two:  Muscle cramps are painful. They come and go and may last for a few seconds or up to 15 minutes. Muscle cramps are often more forceful and last longer than muscle spasms.  Muscle spasms may or may not be  painful. They may also last just a few seconds or much longer. Certain medical conditions, such as diabetes or Parkinson's disease, can make it more likely to develop cramps or spasms. However, cramps or spasms are usually not caused by a serious underlying problem. Common causes include:  Doing more physical work or exercise than your body is ready for (overexertion).  Overuse from repeating certain movements too many times.  Remaining in a certain position for a long period of time.  Improper preparation, form, or technique while playing a sport or doing an activity.  Dehydration.  Injury.  Side effects of some medicines.  Abnormally low levels of the salts and minerals in your blood (electrolytes), especially potassium and calcium. This could happen if you are taking water pills (diuretics) or if you are pregnant. In many cases, the cause of muscle cramps or spasms is not known. Follow these instructions at home: Managing pain and stiffness      Try massaging, stretching, and relaxing the affected muscle. Do this for several minutes at a time.  If directed, apply heat to tight or tense muscles as often as told by your health care provider. Use the heat source that your health care provider recommends, such as a moist heat pack or a heating pad. ? Place a towel between your skin and the heat source. ? Leave the heat on for 20-30 minutes. ? Remove the heat if your skin turns bright red. This is especially important if you are unable to  feel pain, heat, or cold. You may have a greater risk of getting burned.  If directed, put ice on the affected area. This may help if you are sore or have pain after a cramp or spasm. ? Put ice in a plastic bag. ? Place a towel between your skin and the bag. ? Leavethe ice on for 20 minutes, 2-3 times a day.  Try taking hot showers or baths to help relax tight muscles. Eating and drinking  Drink enough fluid to keep your urine pale yellow.  Staying well hydrated may help prevent cramps or spasms.  Eat a healthy diet that includes plenty of nutrients to help your muscles function. A healthy diet includes fruits and vegetables, lean protein, whole grains, and low-fat or nonfat dairy products. General instructions  If you are having frequent cramps, avoid intense exercise for several days.  Take over-the-counter and prescription medicines only as told by your health care provider.  Pay attention to any changes in your symptoms.  Keep all follow-up visits as told by your health care provider. This is important. Contact a health care provider if:  Your cramps or spasms get more severe or happen more often.  Your cramps or spasms do not improve over time. Summary  Muscle cramps and spasms occur when a muscle or muscles tighten and you have no control over this tightening (involuntary muscle contraction).  The most common place for cramps or spasms to occur is in the calf muscles of the leg.  Massaging, stretching, and relaxing the affected muscle may relieve the cramp or spasm.  Drink enough fluid to keep your urine pale yellow. Staying well hydrated may help prevent cramps or spasms. This information is not intended to replace advice given to you by your health care provider. Make sure you discuss any questions you have with your health care provider. Document Released: 01/31/2002 Document Revised: 01/04/2018 Document Reviewed: 01/04/2018 Elsevier Interactive Patient Education  2019 [employer].

## 2018-10-21 ENCOUNTER — Ambulatory Visit: Payer: BC Managed Care – PPO | Admitting: Emergency Medicine

## 2018-10-25 ENCOUNTER — Telehealth: Payer: Self-pay | Admitting: Family Medicine

## 2018-10-25 NOTE — Telephone Encounter (Signed)
mychart message sent to pt about their appointment with Dr Shaw °

## 2018-11-15 ENCOUNTER — Ambulatory Visit: Payer: BC Managed Care – PPO | Admitting: Family Medicine

## 2019-04-05 ENCOUNTER — Telehealth: Payer: BC Managed Care – PPO

## 2019-04-05 ENCOUNTER — Telehealth: Payer: Self-pay | Admitting: Family Medicine

## 2019-04-05 NOTE — Telephone Encounter (Signed)
Pt requires work note outlining her existing conditions. Pt is requesting note to submit through [facility] and would like a CB when uploaded. If pt requires an OV, she would like a CB with that information

## 2019-05-18 ENCOUNTER — Other Ambulatory Visit: Payer: Self-pay

## 2019-05-18 DIAGNOSIS — Z20822 Contact with and (suspected) exposure to covid-19: Secondary | ICD-10-CM

## 2019-05-20 LAB — NOVEL CORONAVIRUS, NAA: SARS-CoV-2, NAA: NOT DETECTED

## 2019-08-24 IMAGING — DX DG CHEST 2V
2 series · 2 of 2 positions shown · non-contrast
Comparison: None available.

CLINICAL DATA: Initial evaluation for acute cough and wheezing.

EXAM:
CHEST - 2 VIEW

[chest pa]
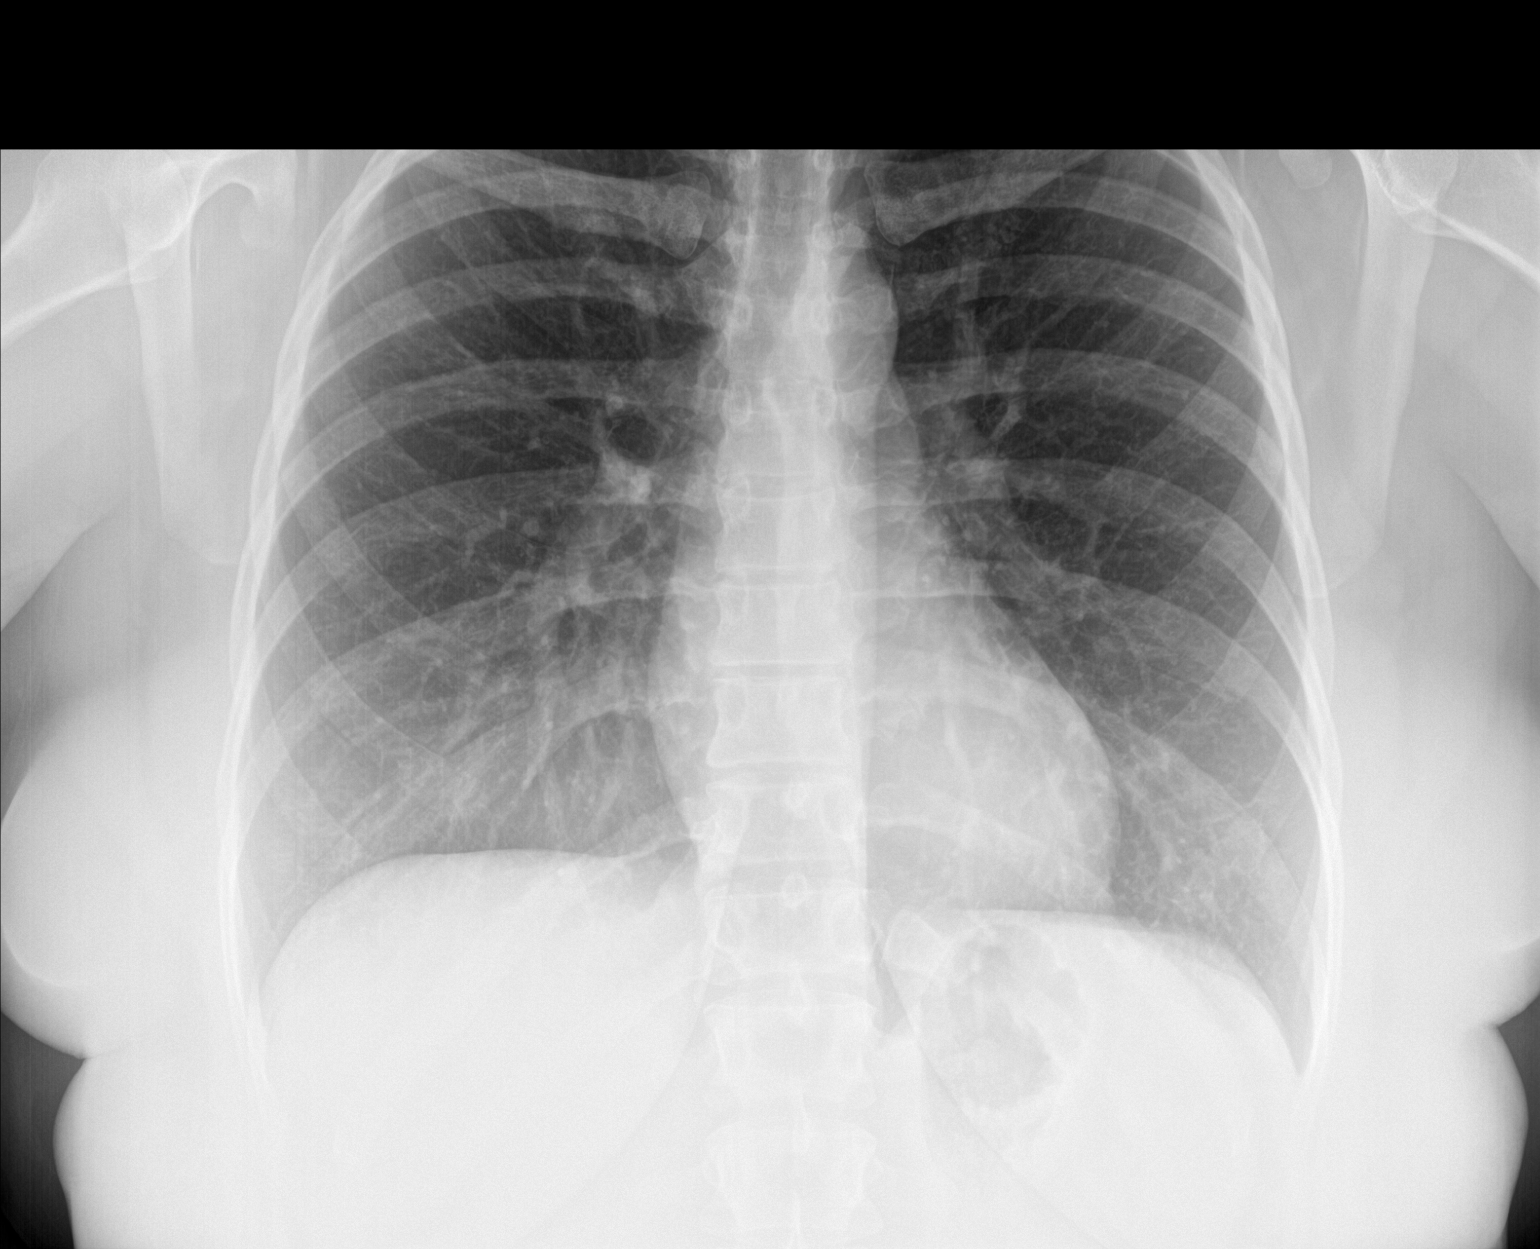

[chest lat]
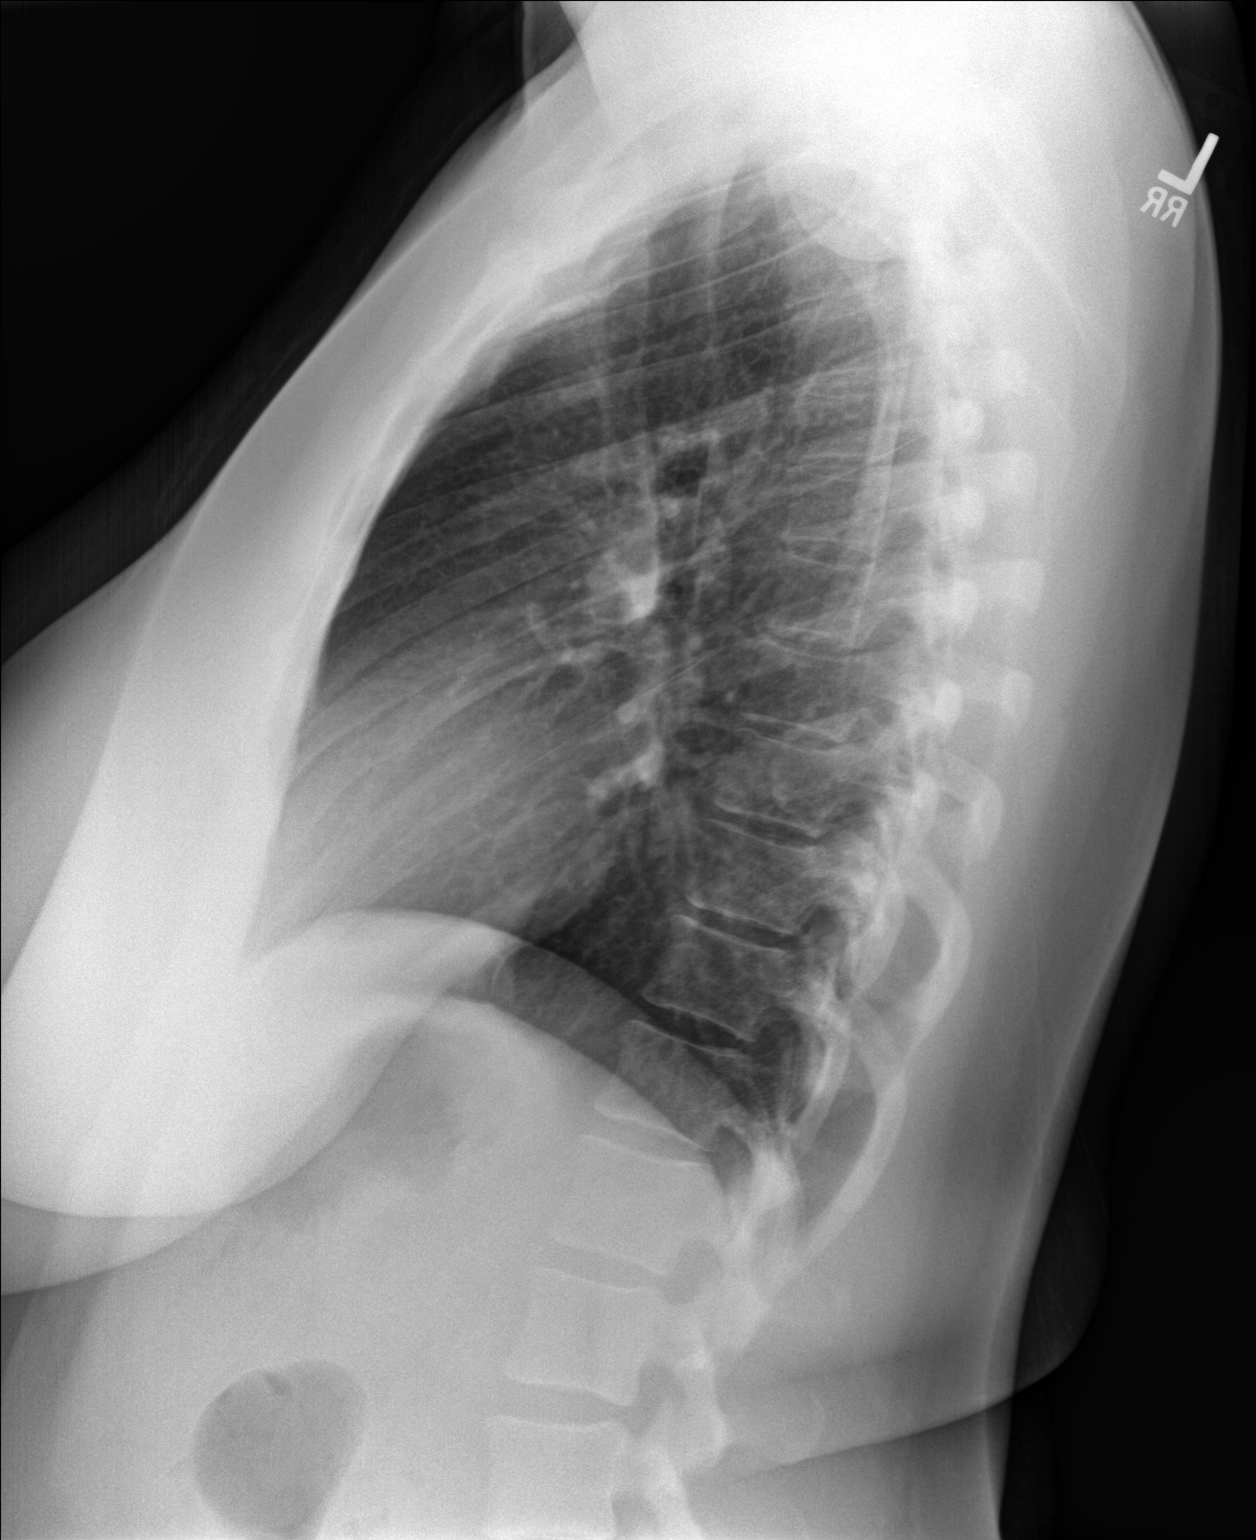

[2 of 2 positions shown; findings below may reference images not displayed]

FINDINGS: Cardiac and mediastinal silhouettes within normal limits.

Lungs normally inflated. Subtle mild peribronchial thickening
present within the mid and lower lungs, which could reflect sequelae
of acute bronchiolitis given provided history. No consolidative
opacity to suggest pneumonia. No edema or effusion. No pneumothorax.

Osseous structures within normal limits.
IMPRESSION: Subtle diffuse peribronchial thickening within the mid and lower
lungs, suggestive of possible bronchiolitis given provided history
of cough and wheezing. No consolidative opacity to suggest
pneumonia.

## 2019-09-08 ENCOUNTER — Other Ambulatory Visit: Payer: Self-pay

## 2019-09-08 DIAGNOSIS — Z20822 Contact with and (suspected) exposure to covid-19: Secondary | ICD-10-CM

## 2019-09-09 LAB — NOVEL CORONAVIRUS, NAA: SARS-CoV-2, NAA: NOT DETECTED
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
## 2020-03-27 ENCOUNTER — Ambulatory Visit (HOSPITAL_COMMUNITY): Payer: Self-pay

## 2022-03-03 ENCOUNTER — Ambulatory Visit (HOSPITAL_COMMUNITY): Payer: BC Managed Care – PPO | Attending: Internal Medicine

## 2022-03-03 ENCOUNTER — Encounter: Payer: Self-pay | Admitting: Student

## 2022-03-03 ENCOUNTER — Ambulatory Visit (INDEPENDENT_AMBULATORY_CARE_PROVIDER_SITE_OTHER): Payer: BC Managed Care – PPO | Admitting: Student

## 2022-03-03 VITALS — BP 128/84 | HR 90 | Temp 98.4°F | Ht 67.0 in | Wt 212.7 lb

## 2022-03-03 DIAGNOSIS — D509 Iron deficiency anemia, unspecified: Secondary | ICD-10-CM | POA: Diagnosis not present

## 2022-03-03 DIAGNOSIS — K635 Polyp of colon: Secondary | ICD-10-CM

## 2022-03-03 DIAGNOSIS — N6320 Unspecified lump in the left breast, unspecified quadrant: Secondary | ICD-10-CM

## 2022-03-03 DIAGNOSIS — E1169 Type 2 diabetes mellitus with other specified complication: Secondary | ICD-10-CM | POA: Diagnosis not present

## 2022-03-03 DIAGNOSIS — Z789 Other specified health status: Secondary | ICD-10-CM

## 2022-03-03 DIAGNOSIS — R55 Syncope and collapse: Secondary | ICD-10-CM

## 2022-03-03 DIAGNOSIS — N920 Excessive and frequent menstruation with regular cycle: Secondary | ICD-10-CM

## 2022-03-03 DIAGNOSIS — Z7985 Long-term (current) use of injectable non-insulin antidiabetic drugs: Secondary | ICD-10-CM

## 2022-03-03 DIAGNOSIS — E785 Hyperlipidemia, unspecified: Secondary | ICD-10-CM

## 2022-03-03 MED ORDER — SEMAGLUTIDE (1 MG/DOSE) 4 MG/3ML ~~LOC~~ SOPN: 1.0000 mg | PEN_INJECTOR | SUBCUTANEOUS | 1 refills | Status: DC

## 2022-03-03 NOTE — Patient Instructions (Signed)
Thank you, Ms.Lemmie Evens for allowing [place] to provide your care today. Today we discussed.  Anemia We will be rechecking your hemoglobin and iron levels I will call you with these results. Please call and follow up with your OBGYN and Gastroenterologist.   Passing Out If you notice these episodes happening more frequently, please call our clinic. If you pass out again, I recommend you go to the emergency department.   Type 2 Diabetes We will restart your ozempic today and follow up with your labs in 1 month  I have ordered the following labs for you:   Lab Orders         CBC no Diff         Iron, TIBC and Ferritin Panel         TSH      Referrals ordered today:    Referral Orders         Referral to Nutrition and Diabetes Services      I have ordered the following medication/changed the following medications:   Stop the following medications: There are no discontinued medications.   Start the following medications: Meds ordered this encounter  Medications   Semaglutide, 1 MG/DOSE, 4 MG/3ML SOPN    Sig: Inject 1 mg into the skin once a week.    Dispense:  3 mL    Refill:  1     Follow up: 2-3 months diabetes check  Should you have any questions or concerns please call the internal medicine clinic at [phone].    Thalia Bloodgood, D.O. [facility]

## 2022-03-04 ENCOUNTER — Encounter: Payer: Self-pay | Admitting: Student

## 2022-03-04 DIAGNOSIS — R55 Syncope and collapse: Secondary | ICD-10-CM | POA: Insufficient documentation

## 2022-03-04 DIAGNOSIS — E119 Type 2 diabetes mellitus without complications: Secondary | ICD-10-CM | POA: Insufficient documentation

## 2022-03-04 DIAGNOSIS — D509 Iron deficiency anemia, unspecified: Secondary | ICD-10-CM | POA: Insufficient documentation

## 2022-03-04 DIAGNOSIS — N920 Excessive and frequent menstruation with regular cycle: Secondary | ICD-10-CM | POA: Insufficient documentation

## 2022-03-04 LAB — CBC
Hematocrit: 32.8 % — ABNORMAL LOW (ref 34.0–46.6)
Hemoglobin: 10.5 g/dL — ABNORMAL LOW (ref 11.1–15.9)
MCH: 22.2 pg — ABNORMAL LOW (ref 26.6–33.0)
MCHC: 32 g/dL (ref 31.5–35.7)
MCV: 70 fL — ABNORMAL LOW (ref 79–97)
Platelets: 344 10*3/uL (ref 150–450)
RBC: 4.72 x10E6/uL (ref 3.77–5.28)
RDW: 17 % — ABNORMAL HIGH (ref 11.7–15.4)
WBC: 6.9 10*3/uL (ref 3.4–10.8)

## 2022-03-04 LAB — IRON,TIBC AND FERRITIN PANEL
Ferritin: 10 ng/mL — ABNORMAL LOW (ref 15–150)
Iron Saturation: 5 % — CL (ref 15–55)
Iron: 20 ug/dL — ABNORMAL LOW (ref 27–159)
Total Iron Binding Capacity: 408 ug/dL (ref 250–450)
UIBC: 388 ug/dL (ref 131–425)

## 2022-03-04 LAB — TSH: TSH: 0.822 u[IU]/mL (ref 0.450–4.500)

## 2022-03-04 MED ORDER — SENNA 8.6 MG PO TABS: 1.0000 | ORAL_TABLET | Freq: Every day | ORAL | 0 refills | Status: AC | PRN

## 2022-03-04 NOTE — Addendum Note (Signed)
Addended by: Belva Agee on: 03/04/2022 09:40 AM   Modules accepted: Orders

## 2022-03-04 NOTE — Addendum Note (Signed)
Addended by: Belva Agee on: 03/04/2022 09:06 AM   Modules accepted: Orders

## 2022-03-04 NOTE — Assessment & Plan Note (Addendum)
Assessment: Patient with history of syncopal episodes, she notes this has occurred three times in her life. Most recent episodes was 2 weeks ago when she was outside talking with a friend when she began to not feel well, felt short of breath. She then fell forward, was able to catch herself and then fell back and hit her head. This was witnessed by a friend who states the patient was taking periodically throughout, telling the friend to not call EMS. She was not evaluated by any medical professionals. Since she has had periodic headaches and episodes of "spacing out." She denies palpiations or chest pain prior to the event.   One of her other episodes of syncope she was in church, prior to this episode she felt short of breath. The first episode she is unsure of what occurred prior. Unclear etiology at this time, no history of sudden cardiac death and cardiac physical exam unremarkable.  EKG without signs of LVH or other concerning signs for structural abnormality.  PR and QT intervals normal.  She does have a history of palpitations and it appears as though at one point they were considering a Holter monitor.  Orthostatic vital signs negative, patient was asymptomatic when performing these maneuvers.  Possible vasovagal with prodromal symptoms caused by her iron deficiency anemia.  She also states that there are certain positions that cause her to feel lightheaded and dizzy such as when she is driving a car and there is a sharp turn.    Her episodes of spacing out seem to be consistent with postconcussive syndrome.  Discussed if these episodes happen more frequently she needs to follow-up with our clinic sooner but that I expect these to improve for the next few weeks.  We will check TSH and see if she has improvement of treatment for her anemia.  If no other clear etiologies will consider referral to cardiology for Holter monitor.  I do not believe she needs an echocardiogram at this  time.  Plan: -Follow-up TSH and anemia labs -If unremarkable, will consider referral to cardiology for implantable loop recorder

## 2022-03-04 NOTE — Assessment & Plan Note (Signed)
Assessment: Patient with longstanding history of menorrhagia. [id number]  When she first started her menstrual cycles she states that her periods were "heavy."  She states she was using 8-9 pads per day for 5 days. She was diagnosed with PCOS at this time. She was eventually started on a p.o. medication in her early 40s that decreased severity of these episodes.  She was then using 2-3 pads per day.  She then self titrated off this medication in her early 30s and continued to not have heavy menstrual cycles.  Over the past 2 months she has noticed that her vaginal bleeding had increased with her menstrual cycles which she is using 8-9 pads per day and also at 1 point had to change close because bleeding was so severe.  Ultimately this is a large change from her baseline and I instructed her to call her OB/GYN to schedule appointment soon as possible.    She has no history, risk factors, or family history of bleeding disorders. CMP in 2018 without hepatic or renal disease. She is not currently on anticoagulation. TSH. She is not currently sexually active and is on contraceptive therapy. This is possibly the start of menopause, but with acute change may need further evaluation with endometrial biopsy sampling with her history of obesity and being in her 40's. Without others symptoms concerning for androgen excess, do not believe she needs androgen levels assessed.  Plan: -continue IDA assessment -follow up with OBGYN -if new symptoms arise, consider further androgen testing

## 2022-03-04 NOTE — Assessment & Plan Note (Addendum)
Assessment: Anna Cole notes a history of iron deficiency anemia.  She is currently taking iron supplementations daily. She is having symptoms that are possibly 2/2 to her IDA; shortness of breath, feeling lightheaded/dizzy, and palpitations. Physical exam she has no conjunctival pallor.  Mucous membranes are moist. Cardiac regular rate/rhythm.   She notes a past medical history of non-painful heavy menstrual bleeding since she was a teenager. This was well controlled on oral contraceptives and then decreased once she stopped these PO medications. Over the past two months she noticed an increase in how heavy her menstrual cycles are. She has not seen her OBGYN in some time. Of note, she also has a history of colonic polyps.    Patient with multiple etiologies for her IDA; heavy menstrual bleeding, colonic polyps, and being vegetarian. She has been on iron supplementation, will recheck hemoglobin and iron levels today. Recommended she follow up with her OBGYN to assess if endometrial biopsy necessary for her change in vaginal bleeding. Also recommended GI follow up as she believes she is due for repeat colonoscopy soon. Will refer for nutritional consult.    Plan: -Hgb and iron studies pending -follow up OBGYN/GI, will obtain records from these offices -work with nutritionist to increase iron rich foods in diet  Addendum: Hgb of 10.5., iron levels still low despite oral therapy. Will place order for IV iron infusion x 2.

## 2022-03-04 NOTE — Assessment & Plan Note (Signed)
Assessment: Patient states she was diagnosed with type 2 diabetes over the past year, A1c was 8.0%.  She was on Ozempic and doing well and her A1c had dropped below 6.5%.  She had lost around 15 pounds on this medicine.  Unfortunately her PCP was moving to a new office and she has been out of the medication for 3 weeks.  Her last A1c was in the past 2 months, do not believe she needs a repeat today.  We will rewrite her Ozempic 1 mg weekly.  At follow-up visit in 2 months we will repeat A1c as well as assess for microalbuminuria.  Also need to make sure that she has referral to ophthalmologist.  We also discussed lifestyle modifications during this visit  Plan: -Prescription sent for Ozempic 1 mg weekly -Follow-up A1c in 2 months -Discussed need for annual eye exams at next visit -Obtain microalbuminuria next visit

## 2022-03-04 NOTE — Progress Notes (Signed)
CC: New to Establish Care  HPI:  [name] is a 41 y.o. female living with a history stated below and presents today for new to establish care. Please see problem based assessment and plan for additional details.  Past Medical History:  Diagnosis Date   Hx of colonic polyps    IBS (irritable bowel syndrome)    Iron deficiency anemia    Left breast mass    Miscarriage    Polycystic ovarian syndrome    T2DM (type 2 diabetes mellitus) (HCC)    Thyroid nodule     Current Outpatient Medications on File Prior to Visit  Medication Sig Dispense Refill   Albuterol Sulfate 108 (90 Base) MCG/ACT AEPB Inhale 1 puff into the lungs every 6 (six) hours as needed. 1 each 2   cefdinir (OMNICEF) 300 MG capsule Take 2 capsules (600 mg total) by mouth daily. 20 capsule 0   meloxicam (MOBIC) 15 MG tablet Take 1 tablet (15 mg total) by mouth every morning. As needed for neck inflammation and pain 90 tablet 1   methocarbamol (ROBAXIN) 500 MG tablet Take 1 tablet (500 mg total) by mouth at bedtime. 90 tablet 1   predniSONE (DELTASONE) 20 MG tablet Beginning Wednesday take 3 each a.m. for 2 days, then 2 daily for 2 days, then 1 daily for 2 days. 12 tablet 0   tiZANidine (ZANAFLEX) 4 MG tablet Take 0.5-1.5 tablets (2-6 mg total) by mouth at bedtime. X 4-6 weeks minimum 30 tablet 1   No current facility-administered medications on file prior to visit.    Family History  Problem Relation Age of Onset   Hyperlipidemia Father    Hypertension Father    Diabetes Father    Parkinson's disease Father    Diabetes Paternal Grandmother     Social History   Socioeconomic History   Marital status: Single    Spouse name: Not on file   Number of children: Not on file   Years of education: Not on file   Highest education level: Not on file  Occupational History   Not on file  Tobacco Use   Smoking status: Never   Smokeless tobacco: Never  Vaping Use   Vaping Use: Never used  Substance and  Sexual Activity   Alcohol use: Yes    Comment: rare   Drug use: Never   Sexual activity: Not Currently    Partners: Male  Other Topics Concern   Not on file  Social History Narrative   Not on file   Social Determinants of Health   Financial Resource Strain: Not on file  Food Insecurity: Not on file  Transportation Needs: Not on file  Physical Activity: Not on file  Stress: Not on file  Social Connections: Not on file  Intimate Partner Violence: Not on file    Review of Systems: ROS negative except for what is noted on the assessment and plan.  Vitals:   03/03/22 1522 03/03/22 1638 03/03/22 1639 03/03/22 1644  BP: 128/82 129/72 129/88 128/84  Pulse: 85 77 79 90  Temp: 98.4 F (36.9 C)     TempSrc: Oral     SpO2: 99%     Weight: 212 lb 11.2 oz (96.5 kg)     Height: 5\' 7"  (1.702 m)       Physical Exam: Constitutional: in no acute distress HENT: normocephalic atraumatic, mucous membranes moist. Eyes: conjunctiva non-erythematous. No conjunctival pallor Neck: supple Cardiovascular: regular rate and rhythm, no m/r/g Pulmonary/Chest: normal work  of breathing on room air, lungs clear to auscultation bilaterally MSK: normal bulk and tone Neurological: alert & oriented x 3, 5/5 strength in bilateral upper and lower extremities, normal gait Skin: warm and dry Psych: Normal mood and thought process  Assessment & Plan:   Iron deficiency anemia Assessment: Ms. Humphres notes a history of iron deficiency anemia.  She is currently taking iron supplementations daily. She is having symptoms that are possibly 2/2 to her IDA; shortness of breath, feeling lightheaded/dizzy, and palpitations. Physical exam she has no conjunctival pallor.  Mucous membranes are moist. Cardiac regular rate/rhythm.   She notes a past medical history of non-painful heavy menstrual bleeding since she was a teenager. This was well controlled on oral contraceptives and then decreased once she stopped these  PO medications. Over the past two months she noticed an increase in how heavy her menstrual cycles are. She has not seen her OBGYN in some time. Of note, she also has a history of colonic polyps.    Patient with multiple etiologies for her IDA; heavy menstrual bleeding, colonic polyps, and being vegetarian. She has been on iron supplementation, will recheck hemoglobin and iron levels today. Recommended she follow up with her OBGYN to assess if endometrial biopsy necessary for her change in vaginal bleeding. Also recommended GI follow up as she believes she is due for repeat colonoscopy soon. Will refer for nutritional consult.    Plan: -Hgb and iron studies pending -follow up OBGYN/GI, will obtain records from these offices -work with nutritionist to increase iron rich foods in diet  Syncope Assessment: Patient with history of syncopal episodes, she notes this has occurred three times in her life. Most recent episodes was 2 weeks ago when she was outside talking with a friend when she began to not feel well, felt short of breath. She then fell forward, was able to catch herself and then fell back and hit her head. This was witnessed by a friend who states the patient was taking periodically throughout, telling the friend to not call EMS. She was not evaluated by any medical professionals. Since she has had periodic headaches and episodes of "spacing out." She denies palpiations or chest pain prior to the event.   One of her other episodes of syncope she was in church, prior to this episode she felt short of breath. The first episode she is unsure of what occurred prior. Unclear etiology at this time, no history of sudden cardiac death and cardiac physical exam unremarkable.  EKG without signs of LVH or other concerning signs for structural abnormality.  PR and QT intervals normal.  She does have a history of palpitations and it appears as though at one point they were considering a Holter monitor.   Orthostatic vital signs negative, patient was asymptomatic when performing these maneuvers.  Possible vasovagal with prodromal symptoms caused by her iron deficiency anemia.  She also states that there are certain positions that cause her to feel lightheaded and dizzy such as when she is driving a car and there is a sharp turn.    Her episodes of spacing out seem to be consistent with postconcussive syndrome.  Discussed if these episodes happen more frequently she needs to follow-up with our clinic sooner but that I expect these to improve for the next few weeks.  We will check TSH and see if she has improvement of treatment for her anemia.  If no other clear etiologies will consider referral to cardiology for Holter monitor.  I do not believe she needs an echocardiogram at this time.  Plan: -Follow-up TSH and anemia labs -If unremarkable, will consider referral to cardiology for implantable loop recorder  Type 2 diabetes mellitus (HCC) Assessment: Patient states she was diagnosed with type 2 diabetes over the past year, A1c was 8.0%.  She was on Ozempic and doing well and her A1c had dropped below 6.5%.  She had lost around 15 pounds on this medicine.  Unfortunately her PCP was moving to a new office and she has been out of the medication for 3 weeks.  Her last A1c was in the past 2 months, do not believe she needs a repeat today.  We will rewrite her Ozempic 1 mg weekly.  At follow-up visit in 2 months we will repeat A1c as well as assess for microalbuminuria.  Also need to make sure that she has referral to ophthalmologist.  We also discussed lifestyle modifications during this visit  Plan: -Prescription sent for Ozempic 1 mg weekly -Follow-up A1c in 2 months -Discussed need for annual eye exams at next visit -Obtain microalbuminuria next visit  Menorrhagia Assessment: Patient with longstanding history of menorrhagia. [id number]  When she first started her menstrual cycles she states that  her periods were "heavy."  She states she was using 8-9 pads per day for 5 days. She was diagnosed with PCOS at this time. She was eventually started on a p.o. medication in her early 63s that decreased severity of these episodes.  She was then using 2-3 pads per day.  She then self titrated off this medication in her early 30s and continued to not have heavy menstrual cycles.  Over the past 2 months she has noticed that her vaginal bleeding had increased with her menstrual cycles which she is using 8-9 pads per day and also at 1 point had to change close because bleeding was so severe.  Ultimately this is a large change from her baseline and I instructed her to call her OB/GYN to schedule appointment soon as possible.    She has no history, risk factors, or family history of bleeding disorders. CMP in 2018 without hepatic or renal disease. She is not currently on anticoagulation. TSH. She is not currently sexually active and is on contraceptive therapy. This is possibly the start of menopause, but with acute change may need further evaluation with endometrial biopsy sampling with her history of obesity and being in her 40's. Without others symptoms concerning for androgen excess, do not believe she needs androgen levels assessed.  Plan: -continue IDA assessment -follow up with OBGYN -if new symptoms arise, consider further androgen testing   Patient discussed with Dr. Lorriane Shire, D.O. [facility] Internal Medicine, PGY-3 Phone: [phone] Date 03/04/2022 Time 7:56 AM

## 2022-03-05 DIAGNOSIS — N6320 Unspecified lump in the left breast, unspecified quadrant: Secondary | ICD-10-CM | POA: Insufficient documentation

## 2022-03-05 DIAGNOSIS — K635 Polyp of colon: Secondary | ICD-10-CM | POA: Insufficient documentation

## 2022-03-05 NOTE — Progress Notes (Signed)
Internal Medicine Clinic Attending  Case discussed with Dr. Katsadouros  At the time of the visit.  We reviewed the resident's history and exam and pertinent patient test results.  I agree with the assessment, diagnosis, and plan of care documented in the resident's note.  

## 2022-03-05 NOTE — Assessment & Plan Note (Signed)
Records sent in from OBGYN. Left breast biopsy benign breast tissue with fibroadenomatous changes. Recommend annual mammograms

## 2022-03-05 NOTE — Assessment & Plan Note (Signed)
Hx of colonic polyps. Has had frequent colonoscopies for these. Note from 05/27/19 patient with hyperplastic polyp, thought to be 2/2 serrated polyp. Next colonoscopy 2025 per patient. Will need to follow up with GI to discuss sooner with IDA.

## 2022-03-05 NOTE — Assessment & Plan Note (Signed)
On document review sent in from [facility], last lipid panel 01/06/22. Total cholesterol of 227 and LDL of 158. ASCVD Risk Estimator, 10 year risk of 2.3%. Encourage lifestyle modifications.

## 2022-03-11 ENCOUNTER — Encounter (HOSPITAL_COMMUNITY)
Admission: RE | Admit: 2022-03-11 | Discharge: 2022-03-11 | Disposition: A | Discharge status: Home / Self Care | Payer: BC Managed Care – PPO | Source: Ambulatory Visit | Attending: Internal Medicine | Admitting: Internal Medicine

## 2022-03-11 DIAGNOSIS — D509 Iron deficiency anemia, unspecified: Secondary | ICD-10-CM | POA: Diagnosis not present

## 2022-03-11 MED ORDER — SODIUM CHLORIDE 0.9 % IV SOLN
250.0000 mg | INTRAVENOUS | Status: DC
  Administered 2022-03-11: 250 mg via INTRAVENOUS
  Filled 2022-03-11: qty 20

## 2022-03-18 ENCOUNTER — Encounter (HOSPITAL_COMMUNITY)
Admission: RE | Admit: 2022-03-18 | Discharge: 2022-03-18 | Disposition: A | Discharge status: Home / Self Care | Payer: BC Managed Care – PPO | Source: Ambulatory Visit | Attending: Internal Medicine | Admitting: Internal Medicine

## 2022-03-18 DIAGNOSIS — D509 Iron deficiency anemia, unspecified: Secondary | ICD-10-CM | POA: Diagnosis not present

## 2022-03-18 MED ORDER — SODIUM CHLORIDE 0.9 % IV SOLN
250.0000 mg | INTRAVENOUS | Status: DC
  Administered 2022-03-18: 250 mg via INTRAVENOUS
  Filled 2022-03-18: qty 20

## 2022-03-31 ENCOUNTER — Other Ambulatory Visit: Payer: Self-pay | Admitting: Student

## 2022-03-31 MED ORDER — SEMAGLUTIDE (1 MG/DOSE) 4 MG/3ML ~~LOC~~ SOPN: 1.0000 mg | PEN_INJECTOR | SUBCUTANEOUS | 3 refills | Status: DC

## 2022-04-07 ENCOUNTER — Telehealth: Payer: Self-pay | Admitting: *Deleted

## 2022-04-07 NOTE — Telephone Encounter (Signed)
Information was sent through CoverMyMeds for PA for Ozempic.  Awaiting determination.

## 2022-04-08 ENCOUNTER — Encounter: Payer: Self-pay | Admitting: Student

## 2022-04-10 ENCOUNTER — Telehealth: Payer: Self-pay

## 2022-04-10 NOTE — Telephone Encounter (Signed)
Pa for pt Anna Cole ) came through on cover my meds .Marland Kitchen Was submitted with last office notes and labs .Marland Kitchen Awaiting approval or denial      UPDATE :      Your information has been submitted to [employer]. To check for an updated outcome later, reopen this PA request from your dashboard.  If [employer] has not responded to your request within 24 hours,

## 2022-04-11 NOTE — Telephone Encounter (Signed)
DECISION :    APPROVED   As long as you remain covered by the [facility] and there are no changes to your plan benefits,   this request is approved for the following time period: 04/10/2022 - 04/10/2025       ( COPY SENT TO PHARMACY ALSO )

## 2022-06-04 ENCOUNTER — Telehealth: Payer: BC Managed Care – PPO | Admitting: Family

## 2022-06-04 DIAGNOSIS — R062 Wheezing: Secondary | ICD-10-CM | POA: Diagnosis not present

## 2022-06-04 DIAGNOSIS — J4520 Mild intermittent asthma, uncomplicated: Secondary | ICD-10-CM

## 2022-06-04 DIAGNOSIS — U071 COVID-19: Secondary | ICD-10-CM

## 2022-06-04 MED ORDER — MOLNUPIRAVIR EUA 200MG CAPSULE: 4.0000 | ORAL_CAPSULE | Freq: Two times a day (BID) | ORAL | 0 refills | Status: AC

## 2022-06-04 NOTE — Patient Instructions (Signed)

## 2022-06-04 NOTE — Progress Notes (Signed)
Virtual Visit Consent   Anna Cole, you are scheduled for a virtual visit with a [facility] provider today. Just as with appointments in the office, your consent must be obtained to participate. Your consent will be active for this visit and any virtual visit you may have with one of our providers in the next 365 days. If you have a MyChart account, a copy of this consent can be sent to you electronically.  As this is a virtual visit, video technology does not allow for your provider to perform a traditional examination. This may limit your provider's ability to fully assess your condition. If your provider identifies any concerns that need to be evaluated in person or the need to arrange testing (such as labs, EKG, etc.), we will make arrangements to do so. Although advances in technology are sophisticated, we cannot ensure that it will always work on either your end or our end. If the connection with a video visit is poor, the visit may have to be switched to a telephone visit. With either a video or telephone visit, we are not always able to ensure that we have a secure connection.  By engaging in this virtual visit, you consent to the provision of healthcare and authorize for your insurance to be billed (if applicable) for the services provided during this visit. Depending on your insurance coverage, you may receive a charge related to this service.  I need to obtain your verbal consent now. Are you willing to proceed with your visit today? Anna Cole has provided verbal consent on 06/04/2022 for a virtual visit (video or telephone). Evelina Dun, FNP  Date: 06/04/2022 6:15 PM  Virtual Visit via Video Note   I, Evelina Dun, connected with  Anna Cole  ([id number], 1981/01/12) on 06/04/22 at  6:15 PM EDT by a video-enabled telemedicine application and verified that I am speaking with the correct person using two identifiers.  Location: Patient: Virtual Visit Location Patient:  Home Provider: Virtual Visit Location Provider: Home Office   I discussed the limitations of evaluation and management by telemedicine and the availability of in person appointments. The patient expressed understanding and agreed to proceed.    History of Present Illness: Anna Cole is a 41 y.o. who identifies as a female who was assigned female at birth, and is being seen today for Anna Cole. She reports her symptoms started 4 days ago and tested positive 3 days ago.   HPI: Cough This is a new problem. The current episode started in the past 7 days. The problem has been gradually worsening. The problem occurs every few minutes. The cough is Non-productive. Associated symptoms include chills, ear congestion, ear pain, a fever, headaches, myalgias, nasal congestion, postnasal drip, a sore throat (improved), shortness of breath and wheezing.    Problems:  Patient Active Problem List   Diagnosis Date Noted   Left breast mass 03/05/2022   Colonic polyp 03/05/2022   Iron deficiency anemia 03/04/2022   Syncope 03/04/2022   Type 2 diabetes mellitus ([place]) 03/04/2022   Menorrhagia 03/04/2022   Hyperlipidemia 06/29/2017   Class 1 obesity due to excess calories without serious comorbidity with body mass index (BMI) of 34.0 to 34.9 in adult 06/29/2017   Palpitations 06/29/2017   Acute vaginitis 06/29/2017    Allergies:  Allergies  Allergen Reactions   Penicillins Hives and Itching   Sulfa Antibiotics Hives   Medications:  Current Outpatient Medications:    molnupiravir EUA (LAGEVRIO) 200 mg CAPS capsule, Take 4 capsules (  800 mg total) by mouth 2 (two) times daily for 5 days., Disp: 40 capsule, Rfl: 0   Albuterol Sulfate 108 (90 Base) MCG/ACT AEPB, Inhale 1 puff into the lungs every 6 (six) hours as needed., Disp: 1 each, Rfl: 2   methocarbamol (ROBAXIN) 500 MG tablet, Take 1 tablet (500 mg total) by mouth at bedtime., Disp: 90 tablet, Rfl: 1   Semaglutide, 1 MG/DOSE, 4 MG/3ML SOPN, Inject 1  mg into the skin once a week., Disp: 3 mL, Rfl: 3   senna (SENOKOT) 8.6 MG TABS tablet, Take 1 tablet (8.6 mg total) by mouth daily as needed for mild constipation., Disp: 30 tablet, Rfl: 0  Observations/Objective: Patient is well-developed, well-nourished in no acute distress.  Resting comfortably  in bed  Head is normocephalic, atraumatic.  No labored breathing.  Speech is clear and coherent with logical content.  Patient is alert and oriented at baseline.  Nasal congestion  Assessment and Plan: 1. COVID-19 - molnupiravir EUA (LAGEVRIO) 200 mg CAPS capsule; Take 4 capsules (800 mg total) by mouth 2 (two) times daily for 5 days.  Dispense: 40 capsule; Refill: 0  2. Mild intermittent asthma, unspecified whether complicated  3. Wheezing  COVID positive, rest, force fluids, tylenol as needed, Quarantine for at least 5 days and you are fever free, then must wear a mask out in public from day 3-82, report any worsening symptoms such as increased shortness of breath, swelling, or continued high fevers. Possible adverse effects discussed with antivirals.  Work note given  Follow Up Instructions: I discussed the assessment and treatment plan with the patient. The patient was provided an opportunity to ask questions and all were answered. The patient agreed with the plan and demonstrated an understanding of the instructions.  A copy of instructions were sent to the patient via MyChart unless otherwise noted below.     The patient was advised to call back or seek an in-person evaluation if the symptoms worsen or if the condition fails to improve as anticipated.  Time:  I spent 11 minutes with the patient via telehealth technology discussing the above problems/concerns.    Evelina Dun, FNP

## 2022-06-10 ENCOUNTER — Encounter: Payer: BC Managed Care – PPO | Admitting: Student

## 2022-06-10 ENCOUNTER — Encounter: Payer: Self-pay | Admitting: Student

## 2022-06-10 ENCOUNTER — Encounter: Payer: BC Managed Care – PPO | Admitting: Dietician

## 2022-07-10 ENCOUNTER — Encounter: Payer: Self-pay | Admitting: Urgent Care

## 2022-07-10 ENCOUNTER — Telehealth: Payer: BC Managed Care – PPO | Admitting: Urgent Care

## 2022-07-10 DIAGNOSIS — J069 Acute upper respiratory infection, unspecified: Secondary | ICD-10-CM

## 2022-07-10 MED ORDER — IPRATROPIUM BROMIDE 0.03 % NA SOLN: 2.0000 | Freq: Two times a day (BID) | NASAL | 0 refills | Status: DC

## 2022-07-10 MED ORDER — BENZONATATE 100 MG PO CAPS: 100.0000 mg | ORAL_CAPSULE | Freq: Two times a day (BID) | ORAL | 0 refills | Status: DC | PRN

## 2022-07-10 MED ORDER — MONTELUKAST SODIUM 10 MG PO TABS: 10.0000 mg | ORAL_TABLET | Freq: Every day | ORAL | 0 refills | Status: DC

## 2022-07-10 NOTE — Patient Instructions (Signed)
Lemmie Evens, thank you for joining Maretta Bees, PA for today's virtual visit.  While this provider is not your primary care provider (PCP), if your PCP is located in our provider database this encounter information will be shared with them immediately following your visit.   A [facility] MyChart account gives you access to today's visit and all your visits, tests, and labs performed at [facility] " click here if you don't have a [facility] MyChart account or go to [email or web address]  Consent: (Patient) Lemmie Evens provided verbal consent for this virtual visit at the beginning of the encounter.  Current Medications:  Current Outpatient Medications:    benzonatate (TESSALON) 100 MG capsule, Take 1 capsule (100 mg total) by mouth 2 (two) times daily as needed for cough., Disp: 20 capsule, Rfl: 0   ipratropium (ATROVENT) 0.03 % nasal spray, Place 2 sprays into both nostrils every 12 (twelve) hours., Disp: 30 mL, Rfl: 0   montelukast (SINGULAIR) 10 MG tablet, Take 1 tablet (10 mg total) by mouth at bedtime., Disp: 14 tablet, Rfl: 0   Albuterol Sulfate 108 (90 Base) MCG/ACT AEPB, Inhale 1 puff into the lungs every 6 (six) hours as needed., Disp: 1 each, Rfl: 2   methocarbamol (ROBAXIN) 500 MG tablet, Take 1 tablet (500 mg total) by mouth at bedtime., Disp: 90 tablet, Rfl: 1   Semaglutide, 1 MG/DOSE, 4 MG/3ML SOPN, Inject 1 mg into the skin once a week., Disp: 3 mL, Rfl: 3   senna (SENOKOT) 8.6 MG TABS tablet, Take 1 tablet (8.6 mg total) by mouth daily as needed for mild constipation., Disp: 30 tablet, Rfl: 0   Medications ordered in this encounter:  Meds ordered this encounter  Medications   benzonatate (TESSALON) 100 MG capsule    Sig: Take 1 capsule (100 mg total) by mouth 2 (two) times daily as needed for cough.    Dispense:  20 capsule    Refill:  0    Order Specific Question:   Supervising Provider    Answer:   LAMPTEY, PHILIP O [[id number]]   montelukast  (SINGULAIR) 10 MG tablet    Sig: Take 1 tablet (10 mg total) by mouth at bedtime.    Dispense:  14 tablet    Refill:  0    Order Specific Question:   Supervising Provider    Answer:   Merrilee Jansky [[id number]]   ipratropium (ATROVENT) 0.03 % nasal spray    Sig: Place 2 sprays into both nostrils every 12 (twelve) hours.    Dispense:  30 mL    Refill:  0    Order Specific Question:   Supervising Provider    Answer:   Merrilee Jansky [[id number]]     *If you need refills on other medications prior to your next appointment, please contact your pharmacy*  Follow-Up: Call back or seek an in-person evaluation if the symptoms worsen or if the condition fails to improve as anticipated.  [facility] [phone]  Other Instructions You have a viral respiratory tract infection. Please start taking the montelukast at night to help with your symptoms. Use the cough medication up to three times daily as needed. Stop your Flonase and switch to Atrovent nasal spray daily to help with inflammation of the nasal passage. It is also recommended that you use nasal saline/ sinus washes to cleans the sinus passages. Hot steam from a shower or vaporizer may also be beneficial to help open up the upper airway.  Eucalyptus can be helpful. If any worsening symptoms such as headache, fever, or shortness of breath, please go to an in person evaluation/ urgent care.    If you have been instructed to have an in-person evaluation today at a local Urgent Care facility, please use the link below. It will take you to a list of all of our available [facility], including address, phone number and hours of operation. Please do not delay care.  [facility]  If you or a family member do not have a primary care provider, use the link below to schedule a visit and establish care. When you choose a [facility] primary care physician or advanced practice provider, you gain a long-term  partner in health. Find a Primary Care Provider  Learn more about [facility] in-office and virtual care options:  - Get Care Now

## 2022-07-10 NOTE — Progress Notes (Signed)
Virtual Visit Consent   Anna Cole, you are scheduled for a virtual visit with a [facility] provider today. Just as with appointments in the office, your consent must be obtained to participate. Your consent will be active for this visit and any virtual visit you may have with one of our providers in the next 365 days. If you have a MyChart account, a copy of this consent can be sent to you electronically.  As this is a virtual visit, video technology does not allow for your provider to perform a traditional examination. This may limit your provider's ability to fully assess your condition. If your provider identifies any concerns that need to be evaluated in person or the need to arrange testing (such as labs, EKG, etc.), we will make arrangements to do so. Although advances in technology are sophisticated, we cannot ensure that it will always work on either your end or our end. If the connection with a video visit is poor, the visit may have to be switched to a telephone visit. With either a video or telephone visit, we are not always able to ensure that we have a secure connection.  By engaging in this virtual visit, you consent to the provision of healthcare and authorize for your insurance to be billed (if applicable) for the services provided during this visit. Depending on your insurance coverage, you may receive a charge related to this service.  I need to obtain your verbal consent now. Are you willing to proceed with your visit today? Janazia Titterington has provided verbal consent on 07/10/2022 for a virtual visit (video or telephone). Chaney Malling, PA  Date: 07/10/2022 7:44 PM  Virtual Visit via Video Note   I, [place], connected with  Skeet Simmer  ([id number], 25-Feb-1981) on 07/10/22 at  6:30 PM EST by a video-enabled telemedicine application and verified that I am speaking with the correct person using two identifiers.  Location: Patient: Virtual Visit Location  Patient: Home Provider: Virtual Visit Location Provider: Home Office   I discussed the limitations of evaluation and management by telemedicine and the availability of in person appointments. The patient expressed understanding and agreed to proceed.    History of Present Illness: Anna Cole is a 41 y.o. who identifies as a female who was assigned female at birth, and is being seen today for URI.  HPI: 41yo female presents today due to URI symptoms. Was out of work for a few days due to feeling like she couldn't breathe, cough, congested, and mild rhinorrhea. Had hx of asthma as a child, but "grew out of it" although she still has an inhaler for intermittent use and had to use it these past few days. Pt has the feeling of substernal chest pain due to the cough. Admits to mucous production. Has been using OTC mucinex, delsym, icool,  Had covid on June 04, 2022, never took the molnupiravir, got over it without complications. Felt like her current symptoms were covid all over again, home test was now negative. Pt was hopeful for a steroid shot.    Problems:  Patient Active Problem List   Diagnosis Date Noted   Left breast mass 03/05/2022   Colonic polyp 03/05/2022   Iron deficiency anemia 03/04/2022   Syncope 03/04/2022   Type 2 diabetes mellitus ([facility]) 03/04/2022   Menorrhagia 03/04/2022   Hyperlipidemia 06/29/2017   Class 1 obesity due to excess calories without serious comorbidity with body mass index (BMI) of 34.0 to 34.9 in adult  06/29/2017   Palpitations 06/29/2017   Acute vaginitis 06/29/2017    Allergies:  Allergies  Allergen Reactions   Penicillins Hives and Itching   Sulfa Antibiotics Hives   Medications:  Current Outpatient Medications:    benzonatate (TESSALON) 100 MG capsule, Take 1 capsule (100 mg total) by mouth 2 (two) times daily as needed for cough., Disp: 20 capsule, Rfl: 0   ipratropium (ATROVENT) 0.03 % nasal spray, Place 2 sprays into both nostrils every 12  (twelve) hours., Disp: 30 mL, Rfl: 0   montelukast (SINGULAIR) 10 MG tablet, Take 1 tablet (10 mg total) by mouth at bedtime., Disp: 14 tablet, Rfl: 0   Albuterol Sulfate 108 (90 Base) MCG/ACT AEPB, Inhale 1 puff into the lungs every 6 (six) hours as needed., Disp: 1 each, Rfl: 2   methocarbamol (ROBAXIN) 500 MG tablet, Take 1 tablet (500 mg total) by mouth at bedtime., Disp: 90 tablet, Rfl: 1   Semaglutide, 1 MG/DOSE, 4 MG/3ML SOPN, Inject 1 mg into the skin once a week., Disp: 3 mL, Rfl: 3   senna (SENOKOT) 8.6 MG TABS tablet, Take 1 tablet (8.6 mg total) by mouth daily as needed for mild constipation., Disp: 30 tablet, Rfl: 0  Observations/Objective: Patient is well-developed, well-nourished in no acute distress.  Resting comfortably at home. Non toxic, no diaphoresis. Pt smiling. NAD Head is normocephalic, atraumatic.  No labored breathing. No coughing appreciated during encounter. Speech is clear and coherent with logical content.  Patient is alert and oriented at baseline.  No visible rhinorrhea, nasal congestion, rash. No scleral injection or tearing.  Assessment and Plan: 1. Viral upper respiratory tract infection  Patients symptoms appear to be improving. She appears well on video evaluation. I do not feel that steroids would be in patients best interest and may cause adverse effects. To help combat the symptoms, will change nasal spray from flonase to atrovent, add montelukast to help with any lower respiratory symptoms, and PRN benzonatate.  Additional supportive measures discussed.   Follow Up Instructions: I discussed the assessment and treatment plan with the patient. The patient was provided an opportunity to ask questions and all were answered. The patient agreed with the plan and demonstrated an understanding of the instructions.  A copy of instructions were sent to the patient via MyChart unless otherwise noted below.    The patient was advised to call back or seek an  in-person evaluation if the symptoms worsen or if the condition fails to improve as anticipated.  Time:  I spent 12 minutes with the patient via telehealth technology discussing the above problems/concerns.    Dayne Chait L Pessy Delamar, PA

## 2022-09-02 ENCOUNTER — Other Ambulatory Visit: Payer: Self-pay

## 2022-09-02 ENCOUNTER — Other Ambulatory Visit: Payer: Self-pay | Admitting: Student

## 2022-09-02 MED ORDER — SEMAGLUTIDE (1 MG/DOSE) 4 MG/3ML ~~LOC~~ SOPN
1.0000 mg | PEN_INJECTOR | SUBCUTANEOUS | 3 refills | Status: DC
Start: 2022-09-02 — End: 2022-09-12

## 2022-09-02 NOTE — Telephone Encounter (Signed)
Will refill but needs to follow up in the clinic. Can we please call for a follow up?

## 2022-09-02 NOTE — Telephone Encounter (Signed)
Per chart, pt has an appt 09/17/22 with her PCP.

## 2022-09-03 NOTE — Telephone Encounter (Signed)
The refill note says that pharmacy does not have this medicine. Can we please call and clarify with them? Thanks

## 2022-09-05 ENCOUNTER — Other Ambulatory Visit: Payer: Self-pay | Admitting: Student

## 2022-09-09 NOTE — Telephone Encounter (Signed)
We can try to prescribe another GLP-1 or wait until it is available. Did they say how long on backorder? Thanks

## 2022-09-17 ENCOUNTER — Ambulatory Visit (INDEPENDENT_AMBULATORY_CARE_PROVIDER_SITE_OTHER): Payer: BC Managed Care – PPO | Admitting: Student

## 2022-09-17 DIAGNOSIS — L739 Follicular disorder, unspecified: Secondary | ICD-10-CM

## 2022-09-17 DIAGNOSIS — D509 Iron deficiency anemia, unspecified: Secondary | ICD-10-CM

## 2022-09-17 DIAGNOSIS — N920 Excessive and frequent menstruation with regular cycle: Secondary | ICD-10-CM

## 2022-09-17 DIAGNOSIS — E1169 Type 2 diabetes mellitus with other specified complication: Secondary | ICD-10-CM

## 2022-09-17 DIAGNOSIS — Z7985 Long-term (current) use of injectable non-insulin antidiabetic drugs: Secondary | ICD-10-CM

## 2022-09-17 DIAGNOSIS — R41840 Attention and concentration deficit: Secondary | ICD-10-CM

## 2022-09-17 DIAGNOSIS — R21 Rash and other nonspecific skin eruption: Secondary | ICD-10-CM | POA: Diagnosis not present

## 2022-09-17 LAB — POCT GLYCOSYLATED HEMOGLOBIN (HGB A1C): Hemoglobin A1C: 5.7 % — AB (ref 4.0–5.6)

## 2022-09-17 LAB — GLUCOSE, CAPILLARY: Glucose-Capillary: 87 mg/dL (ref 70–99)

## 2022-09-17 NOTE — Patient Instructions (Addendum)
Diabetes Your A1c is going great Continue with your current medications  Iron deficiency We will repeat labs and I will call with results  Heavy menstrual periods Please call you OB/GYN to discuss this further if you continue to have heavy periods  Rash Please try switching your soaps and see if the helps with the rashes  Please follow up with dermatology Call us if you cannot get an appointment with your prior dermatologist   Boil Keep the iodoine pack in for 2-3 days  If you develop signs of infection such as redness, drainage, and warmth please give our clinic a call  Follow up for evaluation of ADD/ADHD

## 2022-09-18 LAB — MICROALBUMIN / CREATININE URINE RATIO
Creatinine, Urine: 43.8 mg/dL
Microalb/Creat Ratio: <7 mg/g creat (ref 0–29)
Microalbumin, Urine: <3 ug/mL

## 2022-09-22 ENCOUNTER — Encounter: Payer: Self-pay | Admitting: Student

## 2022-09-22 DIAGNOSIS — L739 Follicular disorder, unspecified: Secondary | ICD-10-CM | POA: Insufficient documentation

## 2022-09-22 DIAGNOSIS — R21 Rash and other nonspecific skin eruption: Secondary | ICD-10-CM | POA: Insufficient documentation

## 2022-09-22 DIAGNOSIS — R41840 Attention and concentration deficit: Secondary | ICD-10-CM | POA: Insufficient documentation

## 2022-09-22 NOTE — Progress Notes (Signed)
Established Patient Office Visit  Subjective   Patient ID: Anna Cole, female    DOB: 04/11/1981  Age: 42 y.o. MRN: [id number]  No chief complaint on file.   Anna Cole is a 42 y.o. person living with a history listed below who presents for clinic for diabetes follow up. Also with boil on R groin and rash. Please refer to problem based charting for further details and assessment and plan of current problem and chronic medical conditions.     Patient Active Problem List   Diagnosis Date Noted   Rash 09/22/2022   Folliculitis 09/22/2022   Inattention 09/22/2022   Left breast mass 03/05/2022   Colonic polyp 03/05/2022   Iron deficiency anemia 03/04/2022   Syncope 03/04/2022   Type 2 diabetes mellitus (HCC) 03/04/2022   Menorrhagia 03/04/2022   Hyperlipidemia 06/29/2017   Class 1 obesity due to excess calories without serious comorbidity with body mass index (BMI) of 34.0 to 34.9 in adult 06/29/2017   Palpitations 06/29/2017      ROS: negative as per HPI    Objective:     There were no vitals taken for this visit. BP Readings from Last 3 Encounters:  03/18/22 121/75  03/11/22 127/86  03/03/22 128/84      Physical Exam Constitutional:      General: She is not in acute distress.    Appearance: Normal appearance.  HENT:     Mouth/Throat:     Mouth: Mucous membranes are moist.     Pharynx: Oropharynx is clear.  Eyes:     Extraocular Movements: Extraocular movements intact.     Conjunctiva/sclera: Conjunctivae normal.     Pupils: Pupils are equal, round, and reactive to light.  Cardiovascular:     Rate and Rhythm: Normal rate and regular rhythm.     Pulses: Normal pulses.     Heart sounds: No murmur heard. Pulmonary:     Effort: Pulmonary effort is normal.     Breath sounds: No rhonchi or rales.  Abdominal:     General: Abdomen is flat. Bowel sounds are normal. There is no distension.     Palpations: Abdomen is soft.     Tenderness: There is no  abdominal tenderness.  Musculoskeletal:        General: Normal range of motion.     Right lower leg: No edema.     Left lower leg: No edema.  Skin:    Capillary Refill: Capillary refill takes less than 2 seconds.     Comments: 1-2 cm patches scatter over right forearm and mid chest with overlying scaling. 2cm nodule of the right inguinal area with small amount of express able sebaceous material, no erythema or warmth   Neurological:     General: No focal deficit present.     Mental Status: She is alert and oriented to person, place, and time.  Psychiatric:        Mood and Affect: Mood normal.        Behavior: Behavior normal.      Results for orders placed or performed in visit on 09/17/22  Microalbumin / Creatinine Urine Ratio  Result Value Ref Range   Creatinine, Urine 43.8 Not Estab. mg/dL   Microalbumin, Urine <5.1 Not Estab. ug/mL   Microalb/Creat Ratio <7 0 - 29 mg/g creat  Glucose, capillary  Result Value Ref Range   Glucose-Capillary 87 70 - 99 mg/dL  POC Hbg V6H  Result Value Ref Range   Hemoglobin A1C 5.7 (A) 4.0 -  5.6 %   HbA1c POC (<> result, manual entry)     HbA1c, POC (prediabetic range)     HbA1c, POC (controlled diabetic range)        The ASCVD Risk score (Arnett DK, et al., 2019) failed to calculate for the following reasons:   Cannot find a previous HDL lab   Cannot find a previous total cholesterol lab    Assessment & Plan:   Problem List Items Addressed This Visit       Endocrine   Type 2 diabetes mellitus (HCC)    A1c 5.7% today. She is tolerating ozempic 1 mg weekly well. Has had some issues with pharmacy running out of stock. Has a few more doses left. I see that PCP ordered tirzepatide. She is not aware of any medication change. Will continue her on ozempic for now. Will reach out to PCP to clarify medications. She saw ophthalmology in November and reports no retinopathy detected.  Continue ozempic 1 mg weekly A1c in 3 months Urine  microalbumin today        Relevant Orders   POC Hbg A1C (Completed)   Microalbumin / Creatinine Urine Ratio (Completed)     Musculoskeletal and Integument   Rash    Scattered hyperpigmented patches on arms and chest. Older lesions with overlying scaling. Suspect may be due to her changing soaps recently. Will have her discontinue this and follow up if not improving.       Folliculitis    Reports boil in the right inguinal area that was drained by GYN about 2 weeks ago. Did not think this has been fully drained. Since thenn has been having increased swelling, redness, and drainage. On exam today has a nodular area with surrounding hyperpigmentation and small amount of expressible sebaceous material. Discussed this is likely folliculitis vs epidermoid inclusion cyst. Seems she has had history of inclusion cysts on her back. Discussed despite drainage this common recurs. She strongly preferred to attempt drainage in office today. If this continue to recur would recommend follow up with dermatology for excision. Return precautions given for wound infection. Would give short course of doxycycline if she develops signs of infection.   Incison & Drainage Procedure Note Procedure - Incision and Drainage Resident - Anna Beard, MD Attending - Anna Reining, DO  Patient positioned appropriately, 2 cc lidocaine with/without epinephrine was used as a local anesthetic. #15 blade scalpal used for single incision.  Minimal amount sebaceous material ,generally friable capsule.   Wound packed with iodoform gauze. Procedure tolerated without complications. Wound dressed with sterile 4x4 guaze and paper tape.          Other   Iron deficiency anemia - Primary    Feeling better after IV iron. Still having inconsistent menorrhagia. Not currently on any iron supplementation. Unfortunately she left clinic prior to having labs collected. She will make a lab only visit for CBC and irons studies.         Relevant Orders   Iron, TIBC and Ferritin Panel   CBC no Diff   Menorrhagia    Reports this has somewhat improved. She saw GYN about 2 weeks ago. Did not mention this issue with GYN. Not having bleeding consistently every menstrual cycle. Cycles remain regular. Has not had a sexual partner in >6 months Will have her reach out to GYN regarding this.       Inattention    Patient concerned about difficulty with attention and task completion at work. Unfortunately we did  not have time to address this in office today. Will need to discuss this further at her next visit.        Return in about 4 weeks (around 10/15/2022).    Anna Beard, MD

## 2022-09-22 NOTE — Assessment & Plan Note (Signed)
A1c 5.7% today. She is tolerating ozempic 1 mg weekly well. Has had some issues with pharmacy running out of stock. Has a few more doses left. I see that PCP ordered tirzepatide. She is not aware of any medication change. Will continue her on ozempic for now. Will reach out to PCP to clarify medications. She saw ophthalmology in November and reports no retinopathy detected.  Continue ozempic 1 mg weekly A1c in 3 months Urine microalbumin today

## 2022-09-22 NOTE — Assessment & Plan Note (Signed)
Patient concerned about difficulty with attention and task completion at work. Unfortunately we did not have time to address this in office today. Will need to discuss this further at her next visit.

## 2022-09-22 NOTE — Assessment & Plan Note (Signed)
Scattered hyperpigmented patches on arms and chest. Older lesions with overlying scaling. Suspect may be due to her changing soaps recently. Will have her discontinue this and follow up if not improving.

## 2022-09-22 NOTE — Assessment & Plan Note (Signed)
Reports boil in the right inguinal area that was drained by GYN about 2 weeks ago. Did not think this has been fully drained. Since thenn has been having increased swelling, redness, and drainage. On exam today has a nodular area with surrounding hyperpigmentation and small amount of expressible sebaceous material. Discussed this is likely folliculitis vs epidermoid inclusion cyst. Seems she has had history of inclusion cysts on her back. Discussed despite drainage this common recurs. She strongly preferred to attempt drainage in office today. If this continue to recur would recommend follow up with dermatology for excision. Return precautions given for wound infection. Would give short course of doxycycline if she develops signs of infection.   Incison & Drainage Procedure Note Procedure - Incision and Drainage Resident - Iona Beard, MD Attending - Joni Reining, DO  Patient positioned appropriately, 2 cc lidocaine with/without epinephrine was used as a local anesthetic. #15 blade scalpal used for single incision.  Minimal amount sebaceous material ,generally friable capsule.   Wound packed with iodoform gauze. Procedure tolerated without complications. Wound dressed with sterile 4x4 guaze and paper tape.

## 2022-09-22 NOTE — Assessment & Plan Note (Addendum)
Reports this has somewhat improved. She saw GYN about 2 weeks ago. Did not mention this issue with GYN. Not having bleeding consistently every menstrual cycle. Cycles remain regular. Has not had a sexual partner in >6 months Will have her reach out to GYN regarding this.

## 2022-09-22 NOTE — Assessment & Plan Note (Signed)
Feeling better after IV iron. Still having inconsistent menorrhagia. Not currently on any iron supplementation. Unfortunately she left clinic prior to having labs collected. She will make a lab only visit for CBC and irons studies.

## 2022-09-23 NOTE — Progress Notes (Signed)
Internal Medicine Clinic Attending  I saw and evaluated the patient.  I personally confirmed the key portions of the history and exam documented by the resident  and I reviewed pertinent patient test results.  The assessment, diagnosis, and plan were formulated together and I agree with the documentation in the resident's note. I was present for the entire procedure. 

## 2022-10-22 ENCOUNTER — Telehealth: Payer: BC Managed Care – PPO | Admitting: Student

## 2022-11-13 ENCOUNTER — Encounter: Payer: BC Managed Care – PPO | Admitting: Student

## 2023-01-27 ENCOUNTER — Encounter: Payer: Self-pay | Admitting: Student

## 2023-02-18 ENCOUNTER — Ambulatory Visit: Payer: BC Managed Care – PPO | Admitting: Student

## 2023-02-18 ENCOUNTER — Encounter: Payer: Self-pay | Admitting: Student

## 2023-02-18 VITALS — BP 119/78 | HR 100 | Temp 98.1°F | Ht 67.0 in | Wt 206.1 lb

## 2023-02-18 DIAGNOSIS — Z7985 Long-term (current) use of injectable non-insulin antidiabetic drugs: Secondary | ICD-10-CM

## 2023-02-18 DIAGNOSIS — D509 Iron deficiency anemia, unspecified: Secondary | ICD-10-CM | POA: Diagnosis not present

## 2023-02-18 DIAGNOSIS — E785 Hyperlipidemia, unspecified: Secondary | ICD-10-CM

## 2023-02-18 DIAGNOSIS — E1169 Type 2 diabetes mellitus with other specified complication: Secondary | ICD-10-CM

## 2023-02-18 MED ORDER — SEMAGLUTIDE (1 MG/DOSE) 4 MG/3ML ~~LOC~~ SOPN
1.0000 mg | PEN_INJECTOR | SUBCUTANEOUS | 2 refills | Status: DC
Start: 2023-02-18 — End: 2023-06-08

## 2023-02-18 NOTE — Progress Notes (Signed)
   CC: f/u iron deficiency anemia, T2DM, HLD  HPI:  Anna Cole is a 42 y.o. female with history listed below presenting to the [facility] for f/u iron deficiency anemia, T2DM, HLD. Please see individualized problem based charting for full HPI.  Past Medical History:  Diagnosis Date   Hx of colonic polyps    IBS (irritable bowel syndrome)    Iron deficiency anemia    Left breast mass    Miscarriage    Palpitations 06/29/2017   Polycystic ovarian syndrome    T2DM (type 2 diabetes mellitus) (HCC)    Thyroid nodule     Review of Systems:  Negative aside from that listed in individualized problem based charting.  Physical Exam:  Vitals:   02/18/23 1428  BP: 119/78  Pulse: 100  Temp: 98.1 F (36.7 C)  TempSrc: Oral  SpO2: 98%  Weight: 206 lb 1.6 oz (93.5 kg)  Height: 5\' 7"  (1.702 m)   Physical Exam Constitutional:      Appearance: Normal appearance. She is obese. She is not ill-appearing.  HENT:     Mouth/Throat:     Mouth: Mucous membranes are moist.     Pharynx: Oropharynx is clear.  Eyes:     General: No scleral icterus.    Extraocular Movements: Extraocular movements intact.     Comments: Conjunctival pallor noted.  Cardiovascular:     Rate and Rhythm: Normal rate and regular rhythm.     Heart sounds: Normal heart sounds. No murmur heard.    No friction rub. No gallop.  Pulmonary:     Effort: Pulmonary effort is normal.     Breath sounds: Normal breath sounds. No wheezing, rhonchi or rales.  Abdominal:     General: Bowel sounds are normal. There is no distension.     Palpations: Abdomen is soft.     Tenderness: There is no abdominal tenderness.  Musculoskeletal:        General: No swelling.  Skin:    General: Skin is warm and dry.     Capillary Refill: Capillary refill takes less than 2 seconds.  Neurological:     General: No focal deficit present.     Mental Status: She is alert.  Psychiatric:        Mood and Affect: Mood normal.        Behavior:  Behavior normal.      Assessment & Plan:   See Encounters Tab for problem based charting.  Patient discussed with Dr. Oswaldo Done

## 2023-02-18 NOTE — Patient Instructions (Signed)
Anna Cole,  It was a pleasure seeing you in the clinic today.   We are checking your iron levels today and your cholesterol labs. I will call you with these results once I have them. We will work to get your iron infusion scheduled pending your lab results. Please come back in 1 month for follow up.  Please call our clinic at [phone] if you have any questions or concerns. The best time to call is Monday-Friday from 9am-4pm, but there is someone available 24/7 at the same number. If you need medication refills, please notify your pharmacy one week in advance and they will send [place] a request.   Thank you for letting us take part in your care. We look forward to seeing you next time!

## 2023-02-18 NOTE — Assessment & Plan Note (Addendum)
Patient with history of iron deficiency anemia noted in the past. Last July, was found to have ferritin of 10, iron sats 5%, iron 20, TIBC 408. She was on oral iron supplementation at that time but required IV iron x2 doses to help with repletion. She has since continued oral iron therapy. Her IDA was thought to be 2/2 menorrhagia. She continues to have regular but heavy menstrual cycles monthly. She follows OBGYN but not taking any OCPs for management of menorrhagia. I discussed this with her and advised her to speak with OBGYN about this which she states she will do.   She does endorse symptoms of episodic lightheadedness, feeling cold, hair loss. These symptoms became more pronounced after her most recent menstrual cycle this past month.   Will repeat iron studies today along with CBC. Will tentatively plan for IV iron infusion this following week given she likely has iron deficiency not manageable with oral iron therapy.  Plan: -f/u iron studies, CBC -will likely need IV iron therapy -continue oral iron supplementation every other day -f/u in 1 month to ensure symptoms have resolved  ADDENDUM: CBC with stable microcytic anemia. Iron studies with ferritin 7, sats 6%, iron 22, TIBC 394. Consistent with iron deficiency anemia. As mentioned above, she will need IV iron therapy. Have ordered IV ferrlecit 250mg  x2 doses to help with repletion.

## 2023-02-18 NOTE — Assessment & Plan Note (Signed)
Patient with history of T2DM, has had elevated A1c levels in the past but unable to locate these on our records. She has been on ozempic 1mg  weekly for long-term management of her diabetes and has done well on this. She is not taking tirzepatide and thus have updated records to reflect her current ozempic dose. Last A1c was 5.7% about 5 months ago. Will repeat this at follow up in 1 month.  Plan: -continue ozempic 1mg  weekly -f/u in 1 month for repeat A1c

## 2023-02-18 NOTE — Assessment & Plan Note (Addendum)
Will repeat lipid panel today. Last lipid panel 12/2021 with total cholesterol 227 and LDL of 158 with 10-year ASCVD risk of 2.3%.   -f/u lipid panel  ADDENDUM: Lipid panel with total cholesterol 223, trigs 224, HDL 48, LDL 135. Her 10-year ASCVD estimated risk is 2.1%. Discussed lifestyle modifications versus statin therapy (given concomitant T2DM). She would prefer to trial lifestyle modifications first and consider statin therapy at next visit if no improvement.

## 2023-02-19 LAB — CBC
Hemoglobin: 10.4 g/dL — ABNORMAL LOW (ref 11.1–15.9)
MCHC: 31.8 g/dL (ref 31.5–35.7)
MCV: 72 fL — ABNORMAL LOW (ref 79–97)
Platelets: 328 10*3/uL (ref 150–450)

## 2023-02-19 NOTE — Progress Notes (Signed)
Internal Medicine Clinic Attending  Case discussed with Dr. Jinwala  At the time of the visit.  We reviewed the resident's history and exam and pertinent patient test results.  I agree with the assessment, diagnosis, and plan of care documented in the resident's note.  

## 2023-02-20 LAB — LIPID PANEL
Chol/HDL Ratio: 4.6 ratio — ABNORMAL HIGH (ref 0.0–4.4)
Cholesterol, Total: 223 mg/dL — ABNORMAL HIGH (ref 100–199)
HDL: 48 mg/dL (ref >39)
LDL Chol Calc (NIH): 135 mg/dL — ABNORMAL HIGH (ref 0–99)
Triglycerides: 224 mg/dL — ABNORMAL HIGH (ref 0–149)
VLDL Cholesterol Cal: 40 mg/dL (ref 5–40)

## 2023-02-20 LAB — IRON,TIBC AND FERRITIN PANEL
Ferritin: 7 ng/mL — ABNORMAL LOW (ref 15–150)
Iron Saturation: 6 % — CL (ref 15–55)
Iron: 22 ug/dL — ABNORMAL LOW (ref 27–159)
Total Iron Binding Capacity: 394 ug/dL (ref 250–450)
UIBC: 372 ug/dL (ref 131–425)

## 2023-02-20 LAB — CBC
Hematocrit: 32.7 % — ABNORMAL LOW (ref 34.0–46.6)
MCH: 23 pg — ABNORMAL LOW (ref 26.6–33.0)
RBC: 4.52 x10E6/uL (ref 3.77–5.28)
RDW: 14.9 % (ref 11.7–15.4)
WBC: 5.9 10*3/uL (ref 3.4–10.8)

## 2023-02-20 NOTE — Addendum Note (Signed)
Addended by: Merrilyn Puma on: 02/20/2023 11:00 AM   Modules accepted: Orders

## 2023-03-03 ENCOUNTER — Encounter (HOSPITAL_COMMUNITY)
Admission: RE | Admit: 2023-03-03 | Discharge: 2023-03-03 | Disposition: A | Discharge status: Home / Self Care | Payer: BC Managed Care – PPO | Source: Ambulatory Visit | Attending: Student in an Organized Health Care Education/Training Program | Admitting: Student in an Organized Health Care Education/Training Program

## 2023-03-03 DIAGNOSIS — D509 Iron deficiency anemia, unspecified: Secondary | ICD-10-CM | POA: Insufficient documentation

## 2023-03-03 MED ORDER — SODIUM CHLORIDE 0.9 % IV SOLN
250.0000 mg | INTRAVENOUS | Status: DC
  Administered 2023-03-03: 250 mg via INTRAVENOUS
  Filled 2023-03-03: qty 250

## 2023-03-10 ENCOUNTER — Encounter (HOSPITAL_COMMUNITY)
Admission: RE | Admit: 2023-03-10 | Discharge: 2023-03-10 | Disposition: A | Discharge status: Home / Self Care | Payer: BC Managed Care – PPO | Source: Ambulatory Visit | Attending: Student in an Organized Health Care Education/Training Program | Admitting: Student in an Organized Health Care Education/Training Program

## 2023-03-10 DIAGNOSIS — D509 Iron deficiency anemia, unspecified: Secondary | ICD-10-CM

## 2023-03-10 MED ORDER — SODIUM CHLORIDE 0.9 % IV SOLN
250.0000 mg | INTRAVENOUS | Status: AC
  Administered 2023-03-10: 250 mg via INTRAVENOUS
  Filled 2023-03-10: qty 250

## 2023-05-26 ENCOUNTER — Telehealth: Payer: BC Managed Care – PPO | Admitting: Physician Assistant

## 2023-05-26 DIAGNOSIS — M545 Low back pain, unspecified: Secondary | ICD-10-CM

## 2023-05-26 MED ORDER — BACLOFEN 10 MG PO TABS
10.0000 mg | ORAL_TABLET | Freq: Three times a day (TID) | ORAL | 0 refills | Status: AC
Start: 2023-05-26 — End: ?

## 2023-05-26 MED ORDER — METHYLPREDNISOLONE 4 MG PO TBPK
ORAL_TABLET | ORAL | 0 refills | Status: AC
Start: 2023-05-26 — End: ?

## 2023-05-26 NOTE — Progress Notes (Signed)
Virtual Visit Consent   Anna Cole, you are scheduled for a virtual visit with a [facility] provider today. Just as with appointments in the office, your consent must be obtained to participate. Your consent will be active for this visit and any virtual visit you may have with one of our providers in the next 365 days. If you have a MyChart account, a copy of this consent can be sent to you electronically.  As this is a virtual visit, video technology does not allow for your provider to perform a traditional examination. This may limit your provider's ability to fully assess your condition. If your provider identifies any concerns that need to be evaluated in person or the need to arrange testing (such as labs, EKG, etc.), we will make arrangements to do so. Although advances in technology are sophisticated, we cannot ensure that it will always work on either your end or our end. If the connection with a video visit is poor, the visit may have to be switched to a telephone visit. With either a video or telephone visit, we are not always able to ensure that we have a secure connection.  By engaging in this virtual visit, you consent to the provision of healthcare and authorize for your insurance to be billed (if applicable) for the services provided during this visit. Depending on your insurance coverage, you may receive a charge related to this service.  I need to obtain your verbal consent now. Are you willing to proceed with your visit today? Anna Cole has provided verbal consent on 05/26/2023 for a virtual visit (video or telephone). Piedad Climes, [place]  Date: 05/26/2023 12:38 PM  Virtual Visit via Video Note   I, Piedad Climes, connected with  Anna Cole  ([id number], Mar 26, 1981) on 05/26/23 at 12:30 PM EDT by a video-enabled telemedicine application and verified that I am speaking with the correct person using two identifiers.  Location: Patient: Virtual Visit  Location Patient: Home Provider: Virtual Visit Location Provider: Home Office   I discussed the limitations of evaluation and management by telemedicine and the availability of in person appointments. The patient expressed understanding and agreed to proceed.    History of Present Illness: Anna Cole is a 42 y.o. who identifies as a female who was assigned female at birth, and is being seen today for lower back pain starting Saturday after cleaning out her closet. Denies any trauma but noted a lot of heavy lifting and strain on her lower back. Notes subsequently developing tightness and tenderness in lower back, bilateral. Denies radiation of pain into her groin or lower extremities.  Has taken an old script for methocarbamol which helps somewhat, as well as topical diclofenac.   HPI: HPI  Problems:  Patient Active Problem List   Diagnosis Date Noted   Rash 09/22/2022   Folliculitis 09/22/2022   Inattention 09/22/2022   Left breast mass 03/05/2022   Colonic polyp 03/05/2022   Iron deficiency anemia 03/04/2022   Syncope 03/04/2022   Type 2 diabetes mellitus (HCC) 03/04/2022   Menorrhagia 03/04/2022   Hyperlipidemia 06/29/2017   Class 1 obesity due to excess calories without serious comorbidity with body mass index (BMI) of 34.0 to 34.9 in adult 06/29/2017    Allergies:  Allergies  Allergen Reactions   Penicillins Hives and Itching   Sulfa Antibiotics Hives   Medications:  Current Outpatient Medications:    baclofen (LIORESAL) 10 MG tablet, Take 1 tablet (10 mg total) by mouth 3 (three) times  daily., Disp: 30 each, Rfl: 0   methylPREDNISolone (MEDROL DOSEPAK) 4 MG TBPK tablet, Take following package directions, Disp: 21 tablet, Rfl: 0   Semaglutide, 1 MG/DOSE, 4 MG/3ML SOPN, Inject 1 mg into the skin once a week., Disp: 3 mL, Rfl: 2   senna (SENOKOT) 8.6 MG TABS tablet, Take 1 tablet (8.6 mg total) by mouth daily as needed for mild constipation., Disp: 30 tablet, Rfl:  0  Observations/Objective: Patient is well-developed, well-nourished in no acute distress.  Resting comfortably at home.  Head is normocephalic, atraumatic.  No labored breathing. Speech is clear and coherent with logical content.  Patient is alert and oriented at baseline.   Assessment and Plan: 1. Acute bilateral low back pain without sciatica - methylPREDNISolone (MEDROL DOSEPAK) 4 MG TBPK tablet; Take following package directions  Dispense: 21 tablet; Refill: 0 - baclofen (LIORESAL) 10 MG tablet; Take 1 tablet (10 mg total) by mouth 3 (three) times daily.  Dispense: 30 each; Refill: 0  Supportive measures and OTC medications reviewed. Stop Diclofenac and Robaxin. Rx Baclofen and Medrol pack. In person follow-up if not quickly resolving.  Follow Up Instructions: I discussed the assessment and treatment plan with the patient. The patient was provided an opportunity to ask questions and all were answered. The patient agreed with the plan and demonstrated an understanding of the instructions.  A copy of instructions were sent to the patient via MyChart unless otherwise noted below.   The patient was advised to call back or seek an in-person evaluation if the symptoms worsen or if the condition fails to improve as anticipated.  Time:  I spent 10 minutes with the patient via telehealth technology discussing the above problems/concerns.    Piedad Climes, PA-C

## 2023-05-26 NOTE — Patient Instructions (Signed)
  Anna Cole, thank you for joining Piedad Climes, PA-C for today's virtual visit.  While this provider is not your primary care provider (PCP), if your PCP is located in our provider database this encounter information will be shared with them immediately following your visit.   A [facility] MyChart account gives you access to today's visit and all your visits, tests, and labs performed at [facility] " click here if you don't have a [facility] MyChart account or go to [email or web address]  Consent: (Patient) Anna Cole provided verbal consent for this virtual visit at the beginning of the encounter.  Current Medications:  Current Outpatient Medications:    Semaglutide, 1 MG/DOSE, 4 MG/3ML SOPN, Inject 1 mg into the skin once a week., Disp: 3 mL, Rfl: 2   senna (SENOKOT) 8.6 MG TABS tablet, Take 1 tablet (8.6 mg total) by mouth daily as needed for mild constipation., Disp: 30 tablet, Rfl: 0   Medications ordered in this encounter:  No orders of the defined types were placed in this encounter.    *If you need refills on other medications prior to your next appointment, please contact your pharmacy*  Follow-Up: Call back or seek an in-person evaluation if the symptoms worsen or if the condition fails to improve as anticipated.  [facility] [phone]  Other Instructions Please hydrate and rest. Avoid heavy lifting or overexertion. Stop the diclofenac. Take the baclofen and Medrol as directed. Continue heating pad use and OTC Tylenol. If not improving or any worsening symptoms despite treatment, you need an in-person evaluation.    If you have been instructed to have an in-person evaluation today at a local Urgent Care facility, please use the link below. It will take you to a list of all of our available [facility], including address, phone number and hours of operation. Please do not delay care.  [facility]  If you or a family member do not have a primary care provider, use the link below to schedule a visit and establish care. When you choose a [facility] primary care physician or advanced practice provider, you gain a long-term partner in health. Find a Primary Care Provider  Learn more about [facility]'s in-office and virtual care options: [facility] - Get Care Now

## 2023-06-08 ENCOUNTER — Other Ambulatory Visit: Payer: Self-pay

## 2023-06-08 DIAGNOSIS — E1169 Type 2 diabetes mellitus with other specified complication: Secondary | ICD-10-CM

## 2023-06-08 MED ORDER — SEMAGLUTIDE (1 MG/DOSE) 4 MG/3ML ~~LOC~~ SOPN: 1.0000 mg | PEN_INJECTOR | SUBCUTANEOUS | 2 refills | Status: DC

## 2023-06-19 ENCOUNTER — Other Ambulatory Visit: Payer: Self-pay

## 2023-06-19 ENCOUNTER — Ambulatory Visit (INDEPENDENT_AMBULATORY_CARE_PROVIDER_SITE_OTHER): Payer: BC Managed Care – PPO | Admitting: Student

## 2023-06-19 VITALS — BP 113/70 | HR 89 | Temp 98.5°F | Ht 67.0 in | Wt 205.8 lb

## 2023-06-19 DIAGNOSIS — D509 Iron deficiency anemia, unspecified: Secondary | ICD-10-CM

## 2023-06-19 DIAGNOSIS — E1169 Type 2 diabetes mellitus with other specified complication: Secondary | ICD-10-CM

## 2023-06-19 DIAGNOSIS — R490 Dysphonia: Secondary | ICD-10-CM | POA: Diagnosis not present

## 2023-06-19 DIAGNOSIS — M5430 Sciatica, unspecified side: Secondary | ICD-10-CM

## 2023-06-19 LAB — POCT GLYCOSYLATED HEMOGLOBIN (HGB A1C): Hemoglobin A1C: 5.5 % (ref 4.0–5.6)

## 2023-06-19 LAB — GLUCOSE, CAPILLARY: Glucose-Capillary: 110 mg/dL — ABNORMAL HIGH (ref 70–99)

## 2023-06-19 NOTE — Progress Notes (Signed)
CC: Sciatica and diabetes follow-up  HPI: [name] is a 42 y.o. female living with a history stated below and presents today for sciatica and diabetes follow-up. Please see problem based assessment and plan for additional details.  Past Medical History:  Diagnosis Date   Hx of colonic polyps    IBS (irritable bowel syndrome)    Iron deficiency anemia    Left breast mass    Miscarriage    Palpitations 06/29/2017   Polycystic ovarian syndrome    T2DM (type 2 diabetes mellitus) (HCC)    Thyroid nodule     Current Outpatient Medications on File Prior to Visit  Medication Sig Dispense Refill   baclofen (LIORESAL) 10 MG tablet Take 1 tablet (10 mg total) by mouth 3 (three) times daily. 30 each 0   methylPREDNISolone (MEDROL DOSEPAK) 4 MG TBPK tablet Take following package directions 21 tablet 0   Semaglutide, 1 MG/DOSE, 4 MG/3ML SOPN Inject 1 mg into the skin once a week. 3 mL 2   senna (SENOKOT) 8.6 MG TABS tablet Take 1 tablet (8.6 mg total) by mouth daily as needed for mild constipation. 30 tablet 0   No current facility-administered medications on file prior to visit.    Family History  Problem Relation Age of Onset   Hyperlipidemia Father    Hypertension Father    Diabetes Father    Parkinson's disease Father    Diabetes Paternal Grandmother     Social History   Socioeconomic History   Marital status: Single    Spouse name: Not on file   Number of children: Not on file   Years of education: Not on file   Highest education level: Not on file  Occupational History   Not on file  Tobacco Use   Smoking status: Never   Smokeless tobacco: Never  Vaping Use   Vaping status: Never Used  Substance and Sexual Activity   Alcohol use: Yes    Comment: rare   Drug use: Never   Sexual activity: Not Currently    Partners: Male  Other Topics Concern   Not on file  Social History Narrative   Not on file   Social Determinants of Health   Financial Resource  Strain: Not on file  Food Insecurity: No Food Insecurity (09/17/2022)   Hunger Vital Sign    Worried About Running Out of Food in the Last Year: Never true    Ran Out of Food in the Last Year: Never true  Transportation Needs: No Transportation Needs (09/17/2022)   PRAPARE - Administrator, Civil Service (Medical): No    Lack of Transportation (Non-Medical): No  Physical Activity: Not on file  Stress: Not on file  Social Connections: Moderately Integrated (09/17/2022)   Social Connection and Isolation Panel [NHANES]    Frequency of Communication with Friends and Family: More than three times a week    Frequency of Social Gatherings with Friends and Family: More than three times a week    Attends Religious Services: More than 4 times per year    Active Member of [employer] or Organizations: Yes    Attends Banker Meetings: More than 4 times per year    Marital Status: Never married  Intimate Partner Violence: Not At Risk (09/17/2022)   Humiliation, Afraid, Rape, and Kick questionnaire    Fear of Current or Ex-Partner: No    Emotionally Abused: No    Physically Abused: No    Sexually Abused: No  Review of Systems: ROS negative except for what is noted on the assessment and plan.  Vitals:   06/19/23 0911 06/19/23 0915  BP: 113/70   Pulse: 89   Temp:  98.5 F (36.9 C)  TempSrc:  Oral  SpO2: 100%   Weight: 205 lb 12.8 oz (93.4 kg)   Height: 5\' 7"  (1.702 m)     Physical Exam: Constitutional: well-appearing in no acute distress HENT: normocephalic atraumatic, mucous membranes moist Eyes: conjunctiva non-erythematous Neck: supple Cardiovascular: regular rate and rhythm, no m/r/g Pulmonary/Chest: normal work of breathing on room air, lungs clear to auscultation bilaterally Abdominal: soft, non-tender, non-distended MSK: normal bulk and tone Neurological: alert & oriented x 3, 5/5 strength in bilateral upper and lower extremities, normal gait Skin: warm and  dry  Assessment & Plan:   Type 2 diabetes mellitus (HCC) Last A1c on 09/17/22 was 5.7. Patient has been on ozempic 1 mg weekly for long-term management of her diabetes and has been tolerating this well. A1c today 5.5. Patient states she would prefer to follow-up on her own with ophthalmology. Foot exam normal. Patient also notes that she was diagnosed with diabetes in 2022 when her A1c was 8.0.  - Referral for eye exam - Continue Ozempic   Iron deficiency anemia Patient has history of IDA. Last CBC was 02/18/23 in which her hemoglobin was 10.4 and MCV was 72. Iron decreased to 22, iron saturation decreased to 6, and ferritin decreased to 7. Today, endorses mild fatigue but no other signs or symptoms.  -F/U CBC, iron panel   Sciatica Seen for similar symptoms via telehealth visit on 10/1. She was given baclofen and a steroid.  These modestly helped but she still continues to endorse a sharp, stinging, radiating pain down her left leg.  She notes that she has had back pain for several years, but the sciatica started roughly 1 month ago.  She is unable to take the baclofen during the day as it causes fatigue.  The pain improves when laying down and is worse with activities such as standing.  She has tried physical therapy in the past but stopped for insurance issues.  Straight leg test was negative. She states that she is interested in restarting physical therapy.  She prefers not to take medications but is amenable to using NSAIDs as needed.  She is scheduled to follow-up in 1 month.  If her pain does not improve at this time, we will follow-up with MRI. - PT referral - NSAIDs as needed  Hoarseness of voice Patient notes that she feels her voice has been going in and out.  Sometimes, she feels like she cannot produce any speech.  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  She denies any signs or symptoms of compression.  Also  denies any heat or cold intolerance, fatigue, or other signs or symptoms.  She is scheduled to follow-up with ENT on 11/15 to further work this up.  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 - Follow-up ENT  Patient seen with Dr. Bary Leriche, MD  [facility] Internal Medicine, PGY-1 Date 06/19/2023 Time 10:48 AM

## 2023-06-19 NOTE — Assessment & Plan Note (Signed)
Last A1c on 09/17/22 was 5.7. Patient has been on ozempic 1 mg weekly for long-term management of her diabetes and has been tolerating this well. A1c today 5.5. Patient states she would prefer to follow-up on her own with ophthalmology. Foot exam normal. Patient also notes that she was diagnosed with diabetes in 2022 when her A1c was 8.0.  - Referral for eye exam - Continue Ozempic

## 2023-06-19 NOTE — Patient Instructions (Signed)
Thank you so much for coming to the clinic today!   Today, we discussed your diabetes, leg pain, and hoarse voice.  For your voice hoarseness, this is most likely from your current job. We recommend that you follow-up with ENT to further investigate this.   For your leg pain, I have put in a referral for PT. We will see you in one month if we need to refer you for an MRI.   For your diabetes, your A1c improved.   If you have any questions please feel free to the call the clinic at anytime at [phone]. It was a pleasure seeing you!  Best, Dr. Rayvon Char

## 2023-06-19 NOTE — Assessment & Plan Note (Signed)
Patient has history of IDA. Last CBC was 02/18/23 in which her hemoglobin was 10.4 and MCV was 72. Iron decreased to 22, iron saturation decreased to 6, and ferritin decreased to 7. Today, endorses mild fatigue but no other signs or symptoms.  -F/U CBC, iron panel

## 2023-06-19 NOTE — Assessment & Plan Note (Signed)
Patient notes that she feels her voice has been going in and out.  Sometimes, she feels like she cannot produce any speech.  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  She denies any signs or symptoms of compression.  Also denies any heat or cold intolerance, fatigue, or other signs or symptoms.  She is scheduled to follow-up with ENT on 11/15 to further work this up.  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 - Follow-up ENT

## 2023-06-19 NOTE — Progress Notes (Signed)
Internal Medicine Clinic Attending  I was physically present during the key portions of the resident provided service and participated in the medical decision making of patient's management care. I reviewed pertinent patient test results.  The assessment, diagnosis, and plan were formulated together and I agree with the documentation in the resident's note.  Reymundo Poll, MD

## 2023-06-19 NOTE — Assessment & Plan Note (Addendum)
Seen for similar symptoms via telehealth visit on 10/1. She was given baclofen and a steroid.  These modestly helped but she still continues to endorse a sharp, stinging, radiating pain down her left leg.  She notes that she has had back pain for several years, but the sciatica started roughly 1 month ago.  She is unable to take the baclofen during the day as it causes fatigue.  The pain improves when laying down and is worse with activities such as standing.  She has tried physical therapy in the past but stopped for insurance issues.  Straight leg test was negative. She states that she is interested in restarting physical therapy.  She prefers not to take medications but is amenable to using NSAIDs as needed.  She is scheduled to follow-up in 1 month.  If her pain does not improve at this time, we will follow-up with MRI. - PT referral - NSAIDs as needed

## 2023-06-21 LAB — CBC
Hematocrit: 35.4 % (ref 34.0–46.6)
Hemoglobin: 11.4 g/dL (ref 11.1–15.9)
MCH: 25.3 pg — ABNORMAL LOW (ref 26.6–33.0)
MCHC: 32.2 g/dL (ref 31.5–35.7)
MCV: 79 fL (ref 79–97)
Platelets: 260 10*3/uL (ref 150–450)
RBC: 4.5 x10E6/uL (ref 3.77–5.28)
RDW: 15.3 % (ref 11.7–15.4)
WBC: 5.4 10*3/uL (ref 3.4–10.8)

## 2023-06-21 LAB — IRON,TIBC AND FERRITIN PANEL
Ferritin: 9 ng/mL — ABNORMAL LOW (ref 15–150)
Iron Saturation: 10 % — ABNORMAL LOW (ref 15–55)
Iron: 37 ug/dL (ref 27–159)
Total Iron Binding Capacity: 370 ug/dL (ref 250–450)
UIBC: 333 ug/dL (ref 131–425)

## 2023-06-22 NOTE — Progress Notes (Signed)
Called patient to discuss lab results. Hemoglobin low normal but improved from 4 months ago. Iron also improved to 37 from 22 four months ago. Furthermore, iron saturation improved from 6 to 10, and ferritin increased from 7 to 9. Encouraged patient to continue iron supplementation, but no changes to treatment as this time.

## 2023-06-29 ENCOUNTER — Telehealth: Payer: Self-pay | Admitting: *Deleted

## 2023-06-29 NOTE — Telephone Encounter (Signed)
Pt stated the doctor told her he would call in a rx to [facility] for the sciatic  nerve pain. She went to [facility] and was told no rx had been sent in. Pt saw Dr Anastasio Auerbach 10/25 I told pt the doctor had stated "NSAIDs as needed". Pt stated they do not work and needs something else.

## 2023-06-29 NOTE — Telephone Encounter (Signed)
-----   Message from [place] [facility] sent at 06/29/2023  8:26 AM EST ----- PLEASE REFER TO MESSAGE TO BELOW.   Appointment Information:     Visit Type: Open Established         Date: 07/13/2023                 Dept: [facility]                 Provider: Morene Crocker                 Time: 9:45 AM                 Length: 30 min   Appt Status: Scheduled      Last read by Lemmie Evens at  4:53 PM on 06/28/2023. June 28, 2023 Lemmie Evens  to [employer] ([employer], Generic)      06/28/23  4:54 PM Hey, my doctor never called in the nerve meds.  Lemmie Evens  to [employer] (Mudlogger, Generic)      06/28/23  4:54 PM For sciatic nerve pain.

## 2023-07-01 NOTE — Telephone Encounter (Signed)
Pt called and informed of Dr Rayvon Char' response; stated she does not remember ("we would restart PT before resuming any medication as this had been successful in the past"). Then she stated "Ok". I reminded her o the f/u appt on 11/18.

## 2023-07-13 ENCOUNTER — Encounter: Payer: BC Managed Care – PPO | Admitting: Student

## 2023-09-15 NOTE — Progress Notes (Unsigned)
Subjective:  CC: anemia  HPI:  [name] is a 43 y.o. female with a past medical history of type 2 diabetes, IDA, HLD who presents today for follow-up on iron deficiency anemia.    Please see problem based assessment and plan for additional details.  Past Medical History:  Diagnosis Date   Hx of colonic polyps    IBS (irritable bowel syndrome)    Iron deficiency anemia    Left breast mass    Miscarriage    Palpitations 06/29/2017   Polycystic ovarian syndrome    T2DM (type 2 diabetes mellitus) (HCC)    Thyroid nodule     Current Outpatient Medications on File Prior to Visit  Medication Sig Dispense Refill   baclofen (LIORESAL) 10 MG tablet Take 1 tablet (10 mg total) by mouth 3 (three) times daily. 30 each 0   methylPREDNISolone (MEDROL DOSEPAK) 4 MG TBPK tablet Take following package directions 21 tablet 0   Semaglutide, 1 MG/DOSE, 4 MG/3ML SOPN Inject 1 mg into the skin once a week. 3 mL 2   senna (SENOKOT) 8.6 MG TABS tablet Take 1 tablet (8.6 mg total) by mouth daily as needed for mild constipation. 30 tablet 0   No current facility-administered medications on file prior to visit.    Family History  Problem Relation Age of Onset   Hyperlipidemia Father    Hypertension Father    Diabetes Father    Parkinson's disease Father    Diabetes Paternal Grandmother     Social History   Socioeconomic History   Marital status: Single    Spouse name: Not on file   Number of children: Not on file   Years of education: Not on file   Highest education level: Not on file  Occupational History   Not on file  Tobacco Use   Smoking status: Never   Smokeless tobacco: Never  Vaping Use   Vaping status: Never Used  Substance and Sexual Activity   Alcohol use: Yes    Comment: rare   Drug use: Never   Sexual activity: Not Currently    Partners: Male  Other Topics Concern   Not on file  Social History Narrative   Not on file   Social Drivers of Health    Financial Resource Strain: Not on file  Food Insecurity: No Food Insecurity (09/17/2022)   Hunger Vital Sign    Worried About Running Out of Food in the Last Year: Never true    Ran Out of Food in the Last Year: Never true  Transportation Needs: No Transportation Needs (09/17/2022)   PRAPARE - Administrator, Civil Service (Medical): No    Lack of Transportation (Non-Medical): No  Physical Activity: Not on file  Stress: Not on file  Social Connections: Moderately Integrated (09/17/2022)   Social Connection and Isolation Panel [NHANES]    Frequency of Communication with Friends and Family: More than three times a week    Frequency of Social Gatherings with Friends and Family: More than three times a week    Attends Religious Services: More than 4 times per year    Active Member of [employer] or Organizations: Yes    Attends Banker Meetings: More than 4 times per year    Marital Status: Never married  Intimate Partner Violence: Not At Risk (09/17/2022)   Humiliation, Afraid, Rape, and Kick questionnaire    Fear of Current or Ex-Partner: No    Emotionally Abused: No  Physically Abused: No    Sexually Abused: No    Review of Systems: ROS negative except for what is noted on the assessment and plan.  Objective:  There were no vitals filed for this visit.  Physical Exam: Constitutional: well-appearing *** sitting in ***, in no acute distress HENT: normocephalic atraumatic, mucous membranes moist Eyes: conjunctiva non-erythematous Neck: supple Cardiovascular: regular rate and rhythm, no m/r/g Pulmonary/Chest: normal work of breathing on room air, lungs clear to auscultation bilaterally Abdominal: soft, non-tender, non-distended MSK: normal bulk and tone Neurological: alert & oriented x 3, 5/5 strength in bilateral upper and lower extremities, normal gait Skin: warm and dry Psych: ***     Assessment & Plan:  No problem-specific Assessment & Plan notes  found for this encounter.      Patient discussed with Dr. {[id number]::"[id number]","I. Hoffman","Mullen","Narendra","Machen","Vincent","Guilloud","Lau"}   [employer], D.O. [facility]  PGY-3 Pager: [phone]  Phone: [phone] Date 09/15/2023  Time 3:36 PM

## 2023-09-16 ENCOUNTER — Ambulatory Visit: Payer: Self-pay | Admitting: Student

## 2023-09-16 ENCOUNTER — Encounter: Payer: Self-pay | Admitting: Student

## 2023-09-16 VITALS — BP 124/69 | HR 84 | Temp 98.4°F | Ht 67.0 in | Wt 201.9 lb

## 2023-09-16 DIAGNOSIS — E1169 Type 2 diabetes mellitus with other specified complication: Secondary | ICD-10-CM

## 2023-09-16 DIAGNOSIS — D509 Iron deficiency anemia, unspecified: Secondary | ICD-10-CM | POA: Diagnosis not present

## 2023-09-16 NOTE — Progress Notes (Signed)
CC: Iron deficiency  HPI:  [name] is a 43 y.o. female living with a history stated below and presents today for iron deficiency.  Please see problem based assessment and plan for additional details. Was unable to address chronic issues as pt was late for appointment.  Past Medical History:  Diagnosis Date   Hx of colonic polyps    IBS (irritable bowel syndrome)    Iron deficiency anemia    Left breast mass    Miscarriage    Palpitations 06/29/2017   Polycystic ovarian syndrome    T2DM (type 2 diabetes mellitus) (HCC)    Thyroid nodule     Current Outpatient Medications on File Prior to Visit  Medication Sig Dispense Refill   baclofen (LIORESAL) 10 MG tablet Take 1 tablet (10 mg total) by mouth 3 (three) times daily. 30 each 0   methylPREDNISolone (MEDROL DOSEPAK) 4 MG TBPK tablet Take following package directions 21 tablet 0   Semaglutide, 1 MG/DOSE, 4 MG/3ML SOPN Inject 1 mg into the skin once a week. 3 mL 2   senna (SENOKOT) 8.6 MG TABS tablet Take 1 tablet (8.6 mg total) by mouth daily as needed for mild constipation. 30 tablet 0   No current facility-administered medications on file prior to visit.    Family History  Problem Relation Age of Onset   Hyperlipidemia Father    Hypertension Father    Diabetes Father    Parkinson's disease Father    Diabetes Paternal Grandmother     Social History   Socioeconomic History   Marital status: Single    Spouse name: Not on file   Number of children: Not on file   Years of education: Not on file   Highest education level: Not on file  Occupational History   Not on file  Tobacco Use   Smoking status: Never   Smokeless tobacco: Never  Vaping Use   Vaping status: Never Used  Substance and Sexual Activity   Alcohol use: Yes    Comment: rare   Drug use: Never   Sexual activity: Not Currently    Partners: Male  Other Topics Concern   Not on file  Social History Narrative   Not on file   Social Drivers  of Health   Financial Resource Strain: Low Risk  (09/16/2023)   Overall Financial Resource Strain (CARDIA)    Difficulty of Paying Living Expenses: Not hard at all  Food Insecurity: No Food Insecurity (09/16/2023)   Hunger Vital Sign    Worried About Running Out of Food in the Last Year: Never true    Ran Out of Food in the Last Year: Never true  Transportation Needs: No Transportation Needs (09/16/2023)   PRAPARE - Administrator, Civil Service (Medical): No    Lack of Transportation (Non-Medical): No  Physical Activity: Inactive (09/16/2023)   Exercise Vital Sign    Days of Exercise per Week: 0 days    Minutes of Exercise per Session: 0 min  Stress: No Stress Concern Present (09/16/2023)   [employer] of Occupational Health - Occupational Stress Questionnaire    Feeling of Stress : Only a little  Social Connections: Moderately Integrated (09/16/2023)   Social Connection and Isolation Panel [NHANES]    Frequency of Communication with Friends and Family: More than three times a week    Frequency of Social Gatherings with Friends and Family: More than three times a week    Attends Religious Services: More than 4  times per year    Active Member of Clubs or Organizations: Yes    Attends Banker Meetings: More than 4 times per year    Marital Status: Never married  Intimate Partner Violence: Not At Risk (09/16/2023)   Humiliation, Afraid, Rape, and Kick questionnaire    Fear of Current or Ex-Partner: No    Emotionally Abused: No    Physically Abused: No    Sexually Abused: No    Review of Systems: ROS negative except for what is noted on the assessment and plan.  Vitals:   09/16/23 1410  BP: 124/69  Pulse: 84  Temp: 98.4 F (36.9 C)  TempSrc: Oral  SpO2: 100%  Weight: 201 lb 14.4 oz (91.6 kg)  Height: 5\' 7"  (1.702 m)    Physical Exam: Constitutional: well-appearing female  in no acute distress HENT: normocephalic atraumatic, mucous membranes  moist Eyes: conjunctiva non-erythematous Neck: supple Cardiovascular: regular rate and rhythm, no m/r/g Pulmonary/Chest: normal work of breathing on room air, lungs clear to auscultation bilaterally Abdominal: soft, non-tender, non-distended   Assessment & Plan:   Iron deficiency anemia Patient with history of IDA in the setting of menorrhagia.  She has follow-up with OB/GYN on this upcoming Monday.  She states that her last period was about a week ago and it was very heavy, and her 1 before that was at the end of December which was also very heavy.  She does endorse symptoms such as dizziness, fatigue, and palpitations.  She has done IV iron before with no complications.  She has been taking liquid iron supplements however feels as if they are not working as she is still symptomatic.  Most recent iron studies from October 2024 showed a hemoglobin of 11.4, ferritin of 9, and iron saturation of 10.  Her last iron infusion was in August 2024.  Iron deficit calculated based off those numbers at 634.  At this time, I think she would benefit from another infusion of iron, order has been placed.  She will follow-up with her OB/GYN to discuss underlying cause on Monday.  Plan: - IV Ferrlecit 250 mg x 2 - Recheck iron studies and CBC  Patient discussed with Dr. Maryagnes Amos Harly Pipkins, M.D. [facility], PGY-2 Pager: [phone] Date 09/16/2023 Time 5:24 PM

## 2023-09-16 NOTE — Patient Instructions (Signed)
Thank you so much for coming to the clinic today!   I have placed an order for the iron infusion, they should contact you soon. We'll also recheck your iron today as well.  If you have any questions please feel free to the call the clinic at anytime at [phone]. It was a pleasure seeing you!  Best, Dr. Thomasene Ripple

## 2023-09-16 NOTE — Assessment & Plan Note (Signed)
Patient with history of IDA in the setting of menorrhagia.  She has follow-up with OB/GYN on this upcoming Monday.  She states that her last period was about a week ago and it was very heavy, and her 1 before that was at the end of December which was also very heavy.  She does endorse symptoms such as dizziness, fatigue, and palpitations.  She has done IV iron before with no complications.  She has been taking liquid iron supplements however feels as if they are not working as she is still symptomatic.  Most recent iron studies from October 2024 showed a hemoglobin of 11.4, ferritin of 9, and iron saturation of 10.  Her last iron infusion was in August 2024.  Iron deficit calculated based off those numbers at 634.  At this time, I think she would benefit from another infusion of iron, order has been placed.  She will follow-up with her OB/GYN to discuss underlying cause on Monday.  Plan: - IV Ferrlecit 250 mg x 2 - Recheck iron studies and CBC

## 2023-09-17 ENCOUNTER — Other Ambulatory Visit: Payer: Self-pay | Admitting: Student

## 2023-09-17 DIAGNOSIS — E1169 Type 2 diabetes mellitus with other specified complication: Secondary | ICD-10-CM

## 2023-09-17 LAB — CBC
Hematocrit: 35.4 % (ref 34.0–46.6)
Hemoglobin: 11.2 g/dL (ref 11.1–15.9)
MCH: 24.1 pg — ABNORMAL LOW (ref 26.6–33.0)
MCHC: 31.6 g/dL (ref 31.5–35.7)
MCV: 76 fL — ABNORMAL LOW (ref 79–97)
Platelets: 260 10*3/uL (ref 150–450)
RBC: 4.64 x10E6/uL (ref 3.77–5.28)
RDW: 15.6 % — ABNORMAL HIGH (ref 11.7–15.4)
WBC: 5.4 10*3/uL (ref 3.4–10.8)

## 2023-09-17 LAB — IRON,TIBC AND FERRITIN PANEL
Ferritin: 12 ng/mL — ABNORMAL LOW (ref 15–150)
Iron Saturation: 10 % — ABNORMAL LOW (ref 15–55)
Iron: 33 ug/dL (ref 27–159)
Total Iron Binding Capacity: 339 ug/dL (ref 250–450)
UIBC: 306 ug/dL (ref 131–425)

## 2023-09-17 NOTE — Progress Notes (Signed)
 Internal Medicine Clinic Attending  Case discussed with the resident physician at the time of the visit.  We reviewed the patient's history, exam, and pertinent patient test results.  I agree with the assessment, diagnosis, and plan of care documented in the resident's note.

## 2023-09-17 NOTE — Telephone Encounter (Signed)
Medication sent to pharmacy  

## 2024-01-15 ENCOUNTER — Encounter (HOSPITAL_BASED_OUTPATIENT_CLINIC_OR_DEPARTMENT_OTHER): Payer: Self-pay | Admitting: Emergency Medicine

## 2024-01-15 ENCOUNTER — Emergency Department (HOSPITAL_BASED_OUTPATIENT_CLINIC_OR_DEPARTMENT_OTHER)
Admission: EM | Admit: 2024-01-15 | Discharge: 2024-01-15 | Disposition: A | Discharge status: Home / Self Care | Attending: Emergency Medicine | Admitting: Emergency Medicine

## 2024-01-15 ENCOUNTER — Other Ambulatory Visit: Payer: Self-pay

## 2024-01-15 DIAGNOSIS — E119 Type 2 diabetes mellitus without complications: Secondary | ICD-10-CM | POA: Diagnosis not present

## 2024-01-15 DIAGNOSIS — R55 Syncope and collapse: Secondary | ICD-10-CM

## 2024-01-15 LAB — CBC
HCT: 38.4 % (ref 36.0–46.0)
Hemoglobin: 12.1 g/dL (ref 12.0–15.0)
MCH: 24.6 pg — ABNORMAL LOW (ref 26.0–34.0)
MCHC: 31.5 g/dL (ref 30.0–36.0)
MCV: 78 fL — ABNORMAL LOW (ref 80.0–100.0)
Platelets: 295 10*3/uL (ref 150–400)
RBC: 4.92 MIL/uL (ref 3.87–5.11)
RDW: 15.9 % — ABNORMAL HIGH (ref 11.5–15.5)
WBC: 6.4 10*3/uL (ref 4.0–10.5)
nRBC: 0 % (ref 0.0–0.2)

## 2024-01-15 LAB — COMPREHENSIVE METABOLIC PANEL WITH GFR
ALT: 10 U/L (ref 0–44)
AST: 15 U/L (ref 15–41)
Albumin: 4.6 g/dL (ref 3.5–5.0)
Alkaline Phosphatase: 72 U/L (ref 38–126)
Anion gap: 15 (ref 5–15)
BUN: 13 mg/dL (ref 6–20)
CO2: 24 mmol/L (ref 22–32)
Calcium: 10.3 mg/dL (ref 8.9–10.3)
Chloride: 99 mmol/L (ref 98–111)
Creatinine, Ser: 0.77 mg/dL (ref 0.44–1.00)
GFR, Estimated: >60 mL/min (ref >60)
Glucose, Bld: 84 mg/dL (ref 70–99)
Potassium: 4.1 mmol/L (ref 3.5–5.1)
Sodium: 138 mmol/L (ref 135–145)
Total Bilirubin: 0.3 mg/dL (ref 0.0–1.2)
Total Protein: 8.4 g/dL — ABNORMAL HIGH (ref 6.5–8.1)

## 2024-01-15 LAB — URINALYSIS, ROUTINE W REFLEX MICROSCOPIC
Bilirubin Urine: NEGATIVE
Glucose, UA: NEGATIVE mg/dL
Ketones, ur: NEGATIVE mg/dL
Leukocytes,Ua: NEGATIVE
Nitrite: NEGATIVE
Protein, ur: NEGATIVE mg/dL
Specific Gravity, Urine: 1.007 (ref 1.005–1.030)
pH: 5.5 (ref 5.0–8.0)

## 2024-01-15 LAB — FERRITIN: Ferritin: 15 ng/mL (ref 11–307)

## 2024-01-15 LAB — CBG MONITORING, ED: Glucose-Capillary: 91 mg/dL (ref 70–99)

## 2024-01-15 LAB — PREGNANCY, URINE: Preg Test, Ur: NEGATIVE

## 2024-01-15 MED ORDER — FERROUS SULFATE 325 (65 FE) MG PO TABS: 325.0000 mg | ORAL_TABLET | Freq: Every day | ORAL | 0 refills | Status: AC

## 2024-01-15 MED ORDER — BISACODYL 5 MG PO TBEC: 5.0000 mg | DELAYED_RELEASE_TABLET | Freq: Every day | ORAL | 0 refills | Status: AC | PRN

## 2024-01-15 NOTE — Discharge Instructions (Addendum)
 As discussed, your labs and EKG are reassuring.  There is no acute findings as the cause of your passing out today.  Follow-up with your primary care provider next week for reevaluation and to possibly start your iron transfusions again.  I have sent a prescription of oral iron supplement to today daily as well as a stool softener.  Get help right away if: You faint. If this happens, do not drive yourself to the hospital. You have a fast heartbeat, or a heartbeat that does not feel regular.

## 2024-01-15 NOTE — ED Triage Notes (Signed)
 Fainted today at work. A few seconds Sob, dizziness  Hx of anemia and low iron  Patient "knows that her iron is low and she needs a transfusion"  Feeling like this for months  headache

## 2024-01-15 NOTE — ED Notes (Signed)
 Pt provided gatorlyte for PO rehydration

## 2024-01-15 NOTE — ED Provider Notes (Signed)
 [place] EMERGENCY DEPARTMENT AT [facility] Provider Note   CSN: [id number] Arrival date & time: 01/15/24  1511     History  Chief Complaint  Patient presents with   Loss of Consciousness    Anna Cole is a 43 y.o. female with history of type 2 diabetes mellitus, menorrhagia, iron deficiency anemia who presents to the ED today for syncope.  Patient reports that she was sitting at her desk at work when she started to feel palpitations, dizziness, and short of breath.  States that she passed out for a few seconds.  Denies hitting her head.  Reports having history of this in the past for which she was getting iron transfusions.  Has not gotten a transfusion in at least 5 months.  Has a slight headache and lightheadedness now.  She started her menstrual cycle while waiting for room in the ED.  Denies any chest pain, palpitations, or shortness of breath at this time.      Home Medications Prior to Admission medications   Medication Sig Start Date End Date Taking? Authorizing Provider  bisacodyl (DULCOLAX) 5 MG EC tablet Take 1 tablet (5 mg total) by mouth daily as needed for moderate constipation. 01/15/24 02/14/24 Yes Sonnie Dusky, PA-C  ferrous sulfate 325 (65 FE) MG tablet Take 1 tablet (325 mg total) by mouth daily. 01/15/24 02/14/24 Yes Sonnie Dusky, PA-C  baclofen  (LIORESAL ) 10 MG tablet Take 1 tablet (10 mg total) by mouth 3 (three) times daily. 05/26/23   Farris Hong, PA-C  methylPREDNISolone  (MEDROL  DOSEPAK) 4 MG TBPK tablet Take following package directions 05/26/23   Farris Hong, PA-C  Semaglutide , 1 MG/DOSE, (OZEMPIC , 1 MG/DOSE,) 4 MG/3ML SOPN INJECT 1MG  INTO THE SKIN ONCE A WEEK 09/17/23   Maxie Spaniel, MD  senna (SENOKOT) 8.6 MG TABS tablet Take 1 tablet (8.6 mg total) by mouth daily as needed for mild constipation. 03/04/22   Katsadouros, Vasilios, MD      Allergies    Penicillins and Sulfa antibiotics    Review of Systems   Review of Systems   Neurological:  Positive for syncope.  All other systems reviewed and are negative.   Physical Exam Updated Vital Signs BP (!) 146/83 (BP Location: Left Arm)   Pulse 81   Temp 98.7 F (37.1 C) (Oral)   Resp 20   LMP 12/22/2023   SpO2 100%  Physical Exam Vitals and nursing note reviewed.  Constitutional:      General: She is not in acute distress.    Appearance: Normal appearance.  HENT:     Head: Normocephalic and atraumatic.     Mouth/Throat:     Mouth: Mucous membranes are moist.  Eyes:     Conjunctiva/sclera: Conjunctivae normal.     Pupils: Pupils are equal, round, and reactive to light.  Cardiovascular:     Rate and Rhythm: Normal rate and regular rhythm.     Pulses: Normal pulses.     Heart sounds: Normal heart sounds.  Pulmonary:     Effort: Pulmonary effort is normal.     Breath sounds: Normal breath sounds.  Abdominal:     Palpations: Abdomen is soft.     Tenderness: There is no abdominal tenderness.  Musculoskeletal:        General: Normal range of motion.     Cervical back: Normal range of motion.  Skin:    General: Skin is warm and dry.     Findings: No rash.  Neurological:  General: No focal deficit present.     Mental Status: She is alert.     Sensory: No sensory deficit.     Motor: No weakness.  Psychiatric:        Mood and Affect: Mood normal.        Behavior: Behavior normal.     ED Results / Procedures / Treatments   Labs (all labs ordered are listed, but only abnormal results are displayed) Labs Reviewed  COMPREHENSIVE METABOLIC PANEL WITH GFR - Abnormal; Notable for the following components:      Result Value   Total Protein 8.4 (*)    All other components within normal limits  CBC - Abnormal; Notable for the following components:   MCV 78.0 (*)    MCH 24.6 (*)    RDW 15.9 (*)    All other components within normal limits  URINALYSIS, ROUTINE W REFLEX MICROSCOPIC - Abnormal; Notable for the following components:   Color, Urine  COLORLESS (*)    Hgb urine dipstick SMALL (*)    Bacteria, UA MANY (*)    All other components within normal limits  PREGNANCY, URINE  FERRITIN  VITAMIN B12  FOLATE  IRON AND TIBC  RETICULOCYTES  CBG MONITORING, ED    EKG EKG Interpretation Date/Time:  Friday Jan 15 2024 15:46:04 EDT Ventricular Rate:  73 PR Interval:  176 QRS Duration:  76 QT Interval:  383 QTC Calculation: 422 R Axis:   42  Text Interpretation: Sinus rhythm Low voltage, precordial leads No significant change since last tracing Confirmed by Almond Army ([id number]) on 01/15/2024 5:59:25 PM  Radiology No results found.  Procedures Procedures    Medications Ordered in ED Medications - No data to display  ED Course/ Medical Decision Making/ A&P                                 Medical Decision Making Amount and/or Complexity of Data Reviewed Labs: ordered.  Risk OTC drugs.   This patient presents to the ED for concern of syncope, this involves an extensive number of treatment options, and is a complaint that carries with it a high risk of complications and morbidity.   Differential diagnosis includes: Cardiac arrhythmia, electrolyte abnormality, hypovolemia, etc. Low suspicion for PE - PERC negative Suspicion for orthostatic hypotension - symptoms occurred while she was sitting down   Comorbidities  See HPI above   Additional History  Additional history obtained from prior records   Cardiac Monitoring / EKG  The patient was maintained on a cardiac monitor.  I personally viewed and interpreted the cardiac monitored which showed: sinus rhythm with a heart rate of 73 bpm.   Lab Tests  I ordered and personally interpreted labs.  The pertinent results include:   CBG of 91 CMP and CBC are reassuring - no acute electrolyte derangement, AKI, infection, or anemia   Problem List / ED Course / Critical Interventions / Medication Management  Patient reports that she was sitting in today  when she started feeling lightheaded, palpitations, and shortness of breath.  States that she passed out for few seconds shortly after that. Patient has been dealing with this for multiple years and has been evaluated by multiple providers to find the cause of this.  Has history of iron deficiency anemia, last transfusion was last year. States that the transfusions were helping with the palpitations and syncopal events.  Last syncopal event was over  a year ago. Syncopal event unlikely to be caused him patient's menstrual cycle as the menstrual cycle started after the syncopal event. No recent head injury or trauma. Oral hydration provided for lightheadedness with improvement prior to discharge. I have reviewed the patients home medicines and have made adjustments as needed Scription for iron sent to patient's pharmacy as well as stool softener.   Social Determinants of Health  Physical activity   Test / Admission - Considered  Discussed findings with patient.  All questions were answered. She is stable and safe for discharge home.  Advise close PCP follow-up. Return precautions given.       Final Clinical Impression(s) / ED Diagnoses Final diagnoses:  Syncope, unspecified syncope type    Rx / DC Orders ED Discharge Orders          Ordered    ferrous sulfate 325 (65 FE) MG tablet  Daily        01/15/24 2013    bisacodyl (DULCOLAX) 5 MG EC tablet  Daily PRN        01/15/24 2013              Sonnie Dusky, PA-C 01/15/24 2215    Almond Army, MD 01/18/24 [phone]

## 2024-01-16 LAB — FOLATE: Folate: 31.8 ng/mL (ref >5.9)

## 2024-01-16 LAB — VITAMIN B12: Vitamin B-12: 500 pg/mL (ref 180–914)

## 2024-01-16 LAB — IRON AND TIBC
Iron: 43 ug/dL (ref 28–170)
Saturation Ratios: 10 % — ABNORMAL LOW (ref 10.4–31.8)
TIBC: 433 ug/dL (ref 250–450)
UIBC: 390 ug/dL

## 2024-01-19 ENCOUNTER — Telehealth: Payer: Self-pay | Admitting: *Deleted

## 2024-01-19 NOTE — Telephone Encounter (Unsigned)
 Copied from CRM [id number]. Topic: Appointments - Scheduling Inquiry for Clinic >> Jan 15, 2024  2:16 PM Anna Cole wrote: Reason for CRM: Patient was calling because she is wanting an iron infusion, she missed her last one and she isn't sure if she needs to come in and start over or can she just get one, patients callback number is [phone].

## 2024-02-03 ENCOUNTER — Encounter: Payer: Self-pay | Admitting: *Deleted
